# Patient Record
Sex: Male | Born: 1990 | Race: White | Hispanic: No | Marital: Single | Smoking: Current every day smoker
Health system: Southern US, Community
[De-identification: ages and names within clinical notes are randomized; demographics above are authoritative.]

## PROBLEM LIST (undated history)

## (undated) ENCOUNTER — Ambulatory Visit: Admission: EM | Payer: Self-pay

## (undated) DIAGNOSIS — F319 Bipolar disorder, unspecified: Secondary | ICD-10-CM

## (undated) DIAGNOSIS — F329 Major depressive disorder, single episode, unspecified: Secondary | ICD-10-CM

## (undated) DIAGNOSIS — F209 Schizophrenia, unspecified: Secondary | ICD-10-CM

## (undated) DIAGNOSIS — F32A Depression, unspecified: Secondary | ICD-10-CM

## (undated) HISTORY — PX: NO PAST SURGERIES: SHX2092

---
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
## 2013-05-05 ENCOUNTER — Emergency Department: Payer: Self-pay | Admitting: Emergency Medicine

## 2013-09-17 ENCOUNTER — Emergency Department: Payer: Self-pay | Admitting: Internal Medicine

## 2014-05-11 ENCOUNTER — Emergency Department: Payer: Self-pay | Admitting: Emergency Medicine

## 2014-08-02 ENCOUNTER — Emergency Department: Payer: Self-pay | Admitting: Emergency Medicine

## 2015-04-08 ENCOUNTER — Emergency Department
Admission: EM | Admit: 2015-04-08 | Discharge: 2015-04-08 | Disposition: A | Discharge status: Home / Self Care | Payer: BLUE CROSS/BLUE SHIELD | Attending: Emergency Medicine | Admitting: Emergency Medicine

## 2015-04-08 DIAGNOSIS — R109 Unspecified abdominal pain: Secondary | ICD-10-CM | POA: Diagnosis not present

## 2015-04-08 DIAGNOSIS — R112 Nausea with vomiting, unspecified: Secondary | ICD-10-CM | POA: Insufficient documentation

## 2015-04-08 DIAGNOSIS — Z79899 Other long term (current) drug therapy: Secondary | ICD-10-CM | POA: Insufficient documentation

## 2015-04-08 DIAGNOSIS — R509 Fever, unspecified: Secondary | ICD-10-CM | POA: Insufficient documentation

## 2015-04-08 DIAGNOSIS — R1033 Periumbilical pain: Secondary | ICD-10-CM | POA: Diagnosis present

## 2015-04-08 LAB — CBC WITH DIFFERENTIAL/PLATELET
Basophils Absolute: 0 10*3/uL (ref 0–0.1)
Basophils Relative: 1 %
EOS ABS: 0 10*3/uL (ref 0–0.7)
Eosinophils Relative: 1 %
HEMATOCRIT: 46.4 % (ref 40.0–52.0)
Hemoglobin: 15.8 g/dL (ref 13.0–18.0)
LYMPHS ABS: 1.2 10*3/uL (ref 1.0–3.6)
Lymphocytes Relative: 21 %
MCH: 30 pg (ref 26.0–34.0)
MCHC: 34 g/dL (ref 32.0–36.0)
MCV: 88.1 fL (ref 80.0–100.0)
MONO ABS: 0.6 10*3/uL (ref 0.2–1.0)
MONOS PCT: 11 %
NEUTROS PCT: 66 %
Neutro Abs: 3.8 10*3/uL (ref 1.4–6.5)
Platelets: 175 10*3/uL (ref 150–440)
RBC: 5.26 MIL/uL (ref 4.40–5.90)
RDW: 13.2 % (ref 11.5–14.5)
WBC: 5.8 10*3/uL (ref 3.8–10.6)

## 2015-04-08 LAB — COMPREHENSIVE METABOLIC PANEL
ALK PHOS: 50 U/L (ref 38–126)
ALT: 20 U/L (ref 17–63)
ANION GAP: 7 (ref 5–15)
AST: 20 U/L (ref 15–41)
Albumin: 4.9 g/dL (ref 3.5–5.0)
BUN: 15 mg/dL (ref 6–20)
CALCIUM: 9.2 mg/dL (ref 8.9–10.3)
CO2: 26 mmol/L (ref 22–32)
CREATININE: 1.05 mg/dL (ref 0.61–1.24)
Chloride: 109 mmol/L (ref 101–111)
Glucose, Bld: 88 mg/dL (ref 65–99)
POTASSIUM: 4.2 mmol/L (ref 3.5–5.1)
Sodium: 142 mmol/L (ref 135–145)
TOTAL PROTEIN: 7.3 g/dL (ref 6.5–8.1)
Total Bilirubin: 2.5 mg/dL — ABNORMAL HIGH (ref 0.3–1.2)

## 2015-04-08 LAB — URINALYSIS COMPLETE WITH MICROSCOPIC (ARMC ONLY)
BACTERIA UA: NONE SEEN
BILIRUBIN URINE: NEGATIVE
GLUCOSE, UA: NEGATIVE mg/dL
Hgb urine dipstick: NEGATIVE
KETONES UR: NEGATIVE mg/dL
Leukocytes, UA: NEGATIVE
NITRITE: NEGATIVE
Protein, ur: NEGATIVE mg/dL
Specific Gravity, Urine: 1.024 (ref 1.005–1.030)
pH: 5 (ref 5.0–8.0)

## 2015-04-08 LAB — LIPASE, BLOOD: Lipase: 17 U/L — ABNORMAL LOW (ref 22–51)

## 2015-04-08 MED ORDER — DICYCLOMINE HCL 20 MG PO TABS: 20.0000 mg | ORAL_TABLET | Freq: Three times a day (TID) | ORAL | Status: DC | PRN

## 2015-04-08 MED ORDER — ONDANSETRON HCL 4 MG PO TABS: 4.0000 mg | ORAL_TABLET | Freq: Every day | ORAL | Status: DC | PRN

## 2015-04-08 NOTE — ED Notes (Signed)
Patient had flu-like symptoms on Friday.  Had a temperature and bodyaches, N/V and diarrhea. Took ibuprofen and pepto-bismol on Friday and Saturday. Began to notice dark stools then. States it is better today but stools are darker than normal. Vomiting has decreased but nausea continues. Had hx of intestinal infection about 5 months ago.

## 2015-04-08 NOTE — ED Provider Notes (Signed)
[facility] Emergency Department Provider Note    ____________________________________________  Time seen: 2230  I have reviewed the triage vital signs and the nursing notes.   HISTORY  Chief Complaint Abdominal Pain   History limited by: Not Limited   HPI RALLY OUCH is a 24 y.o. male who presents to the emergency department today because of concerns for abdominal pain. Patient states that the abdominal pain is worse in the periumbilical region.The patient states that the pain started 3 days ago. It has been fairly constant. It is gradually gotten worse. He did have some associated nausea and vomiting. Additionally has had some decreased by mouth intake. He had some fevers at the beginning. He states that he had an intestinal infection roughly 5 months ago,  that pain was located in the right lower quadrant. No recent tick exposure.  No past medical history on file.  There are no active problems to display for this patient.   No past surgical history on file.  Current Outpatient Rx  Name  Route  Sig  Dispense  Refill  . clonazePAM (KLONOPIN) 1 MG tablet   Oral   Take 1 mg by mouth daily.         Marland Kitchen ibuprofen (ADVIL,MOTRIN) 200 MG tablet   Oral   Take 200 mg by mouth every 6 (six) hours as needed for fever, headache or moderate pain.           Allergies Poison ivy extract  No family history on file.  Social History Works with [employer]   Review of Systems  Constitutional: Positive for fever. Cardiovascular: Negative for chest pain. Respiratory: Negative for shortness of breath. Gastrointestinal: Positive for abdominal pain Genitourinary: Negative for dysuria. Musculoskeletal: Negative for back pain. Skin: Negative for rash. Neurological: Negative for headaches, focal weakness or numbness.  10-point ROS otherwise negative.  ____________________________________________   PHYSICAL EXAM:  VITAL SIGNS: ED Triage  Vitals  Enc Vitals Group     BP 04/08/15 1904 119/76 mmHg     Pulse Rate 04/08/15 1904 102     Resp 04/08/15 1904 18     Temp 04/08/15 1904 98.3 F (36.8 C)     Temp Source 04/08/15 1904 Oral     SpO2 04/08/15 1904 100 %     Weight 04/08/15 1904 130 lb (58.968 kg)     Height 04/08/15 1904 6' (1.829 m)     Head Cir --      Peak Flow --      Pain Score 04/08/15 1904 6   Constitutional: Alert and oriented. Well appearing and in no distress. Eyes: Conjunctivae are normal. PERRL. Normal extraocular movements. ENT   Head: Normocephalic and atraumatic.   Nose: No congestion/rhinnorhea.   Mouth/Throat: Mucous membranes are moist.   Neck: No stridor. Hematological/Lymphatic/Immunilogical: No cervical lymphadenopathy. Cardiovascular: Normal rate, regular rhythm.  No murmurs, rubs, or gallops. Respiratory: Normal respiratory effort without tachypnea nor retractions. Breath sounds are clear and equal bilaterally. No wheezes/rales/rhonchi. Gastrointestinal: Soft and minimally tender to palpation in the periumbilical region. No right lower quadrant tenderness. No Murphy sign. No rebound. No guarding. No distention.  Genitourinary: Deferred Musculoskeletal: Normal range of motion in all extremities. No joint effusions.  No lower extremity tenderness nor edema. Neurologic:  Normal speech and language. No gross focal neurologic deficits are appreciated. Speech is normal.  Skin:  Skin is warm, dry and intact. No rash noted. Psychiatric: Mood and affect are normal. Speech and behavior are normal. Patient  exhibits appropriate insight and judgment.  ____________________________________________    LABS (pertinent positives/negatives)  Labs Reviewed  COMPREHENSIVE METABOLIC PANEL - Abnormal; Notable for the following:    Total Bilirubin 2.5 (*)    All other components within normal limits  LIPASE, BLOOD - Abnormal; Notable for the following:    Lipase 17 (*)    All other components  within normal limits  URINALYSIS COMPLETEWITH MICROSCOPIC ([facility] ONLY) - Abnormal; Notable for the following:    Color, Urine YELLOW (*)    APPearance CLEAR (*)    Squamous Epithelial / LPF 0-5 (*)    All other components within normal limits  CBC WITH DIFFERENTIAL/PLATELET     ____________________________________________   EKG  None  ____________________________________________    RADIOLOGY  None  ____________________________________________   PROCEDURES  Procedure(s) performed: None  Critical Care performed: No  ____________________________________________   INITIAL IMPRESSION / ASSESSMENT AND PLAN / ED COURSE  Pertinent labs & imaging results that were available during my care of the patient were reviewed by me and considered in my medical decision making (see chart for details).  Patient presented to the emergency department today because of concerns for abdominal pain. On exam patient with very minimal tenderness palpation in periumbilical region. No right lower quadrant tenderness. Patient blood work here without any concerning findings except for mildly elevated bilirubin. I do question if this is secondary to stress and not eating. Patient does not have any Murphy sign or any other signs of obstructing gallbladder disease. I doubt patient is suffering from kidney stone, gallstones or appendicitis. Will plan on discharging home with Bentyl and antiemetics.  ____________________________________________   FINAL CLINICAL IMPRESSION(S) / ED DIAGNOSES  Final diagnoses:  Abdominal pain, unspecified abdominal location     Phineas Semen, MD 04/08/15 2341

## 2015-04-08 NOTE — ED Notes (Signed)
Abd pain for a week and black diarrhea hx of "infection in the stomach".

## 2015-04-08 NOTE — Discharge Instructions (Signed)
Please seek medical attention for any high fevers, chest pain, shortness of breath, change in behavior, persistent vomiting, bloody stool or any other new or concerning symptoms. ° °Abdominal Pain °Many things can cause abdominal pain. Usually, abdominal pain is not caused by a disease and will improve without treatment. It can often be observed and treated at home. Your health care provider will do a physical exam and possibly order blood tests and X-rays to help determine the seriousness of your pain. However, in many cases, more time must pass before a clear cause of the pain can be found. Before that point, your health care provider may not know if you need more testing or further treatment. °HOME CARE INSTRUCTIONS  °Monitor your abdominal pain for any changes. The following actions may help to alleviate any discomfort you are experiencing: °· Only take over-the-counter or prescription medicines as directed by your health care provider. °· Do not take laxatives unless directed to do so by your health care provider. °· Try a clear liquid diet (broth, tea, or water) as directed by your health care provider. Slowly move to a bland diet as tolerated. °SEEK MEDICAL CARE IF: °· You have unexplained abdominal pain. °· You have abdominal pain associated with nausea or diarrhea. °· You have pain when you urinate or have a bowel movement. °· You experience abdominal pain that wakes you in the night. °· You have abdominal pain that is worsened or improved by eating food. °· You have abdominal pain that is worsened with eating fatty foods. °· You have a fever. °SEEK IMMEDIATE MEDICAL CARE IF:  °· Your pain does not go away within 2 hours. °· You keep throwing up (vomiting). °· Your pain is felt only in portions of the abdomen, such as the right side or the left lower portion of the abdomen. °· You pass bloody or black tarry stools. °MAKE SURE YOU: °· Understand these instructions.   °· Will watch your condition.   °· Will  get help right away if you are not doing well or get worse.   °Document Released: 05/21/2005 Document Revised: 08/16/2013 Document Reviewed: 04/20/2013 °ExitCare® Patient Information ©2015 [employer]. This information is not intended to replace advice given to you by your health care provider. Make sure you discuss any questions you have with your health care provider. ° °

## 2015-10-01 ENCOUNTER — Other Ambulatory Visit: Payer: Self-pay | Admitting: Orthopedic Surgery

## 2015-10-01 DIAGNOSIS — M25561 Pain in right knee: Secondary | ICD-10-CM

## 2015-10-01 DIAGNOSIS — M25562 Pain in left knee: Secondary | ICD-10-CM

## 2015-10-11 ENCOUNTER — Ambulatory Visit
Admission: RE | Admit: 2015-10-11 | Discharge: 2015-10-11 | Disposition: A | Discharge status: Home / Self Care | Payer: BLUE CROSS/BLUE SHIELD | Source: Ambulatory Visit | Attending: Orthopedic Surgery | Admitting: Orthopedic Surgery

## 2015-10-11 DIAGNOSIS — M25562 Pain in left knee: Secondary | ICD-10-CM

## 2015-10-11 DIAGNOSIS — M25461 Effusion, right knee: Secondary | ICD-10-CM | POA: Diagnosis not present

## 2015-10-11 DIAGNOSIS — M948X6 Other specified disorders of cartilage, lower leg: Secondary | ICD-10-CM | POA: Insufficient documentation

## 2015-10-11 DIAGNOSIS — M25561 Pain in right knee: Secondary | ICD-10-CM | POA: Insufficient documentation

## 2015-10-31 ENCOUNTER — Emergency Department
Admission: EM | Admit: 2015-10-31 | Discharge: 2015-10-31 | Disposition: A | Discharge status: Home / Self Care | Payer: BLUE CROSS/BLUE SHIELD | Attending: Emergency Medicine | Admitting: Emergency Medicine

## 2015-10-31 DIAGNOSIS — J029 Acute pharyngitis, unspecified: Secondary | ICD-10-CM | POA: Insufficient documentation

## 2015-10-31 MED ORDER — AZITHROMYCIN 500 MG PO TABS
500.0000 mg | ORAL_TABLET | Freq: Once | ORAL | Status: AC
  Administered 2015-10-31: 500 mg via ORAL
  Filled 2015-10-31: qty 1

## 2015-10-31 MED ORDER — AZITHROMYCIN 250 MG PO TABS: ORAL_TABLET | ORAL | Status: DC

## 2015-10-31 NOTE — ED Notes (Signed)
Pt reports sore throat x 3 days. Pt denies fevers

## 2015-10-31 NOTE — Discharge Instructions (Signed)
Pharyngitis Pharyngitis is redness, pain, and swelling (inflammation) of your pharynx.  CAUSES  Pharyngitis is usually caused by infection. Most of the time, these infections are from viruses (viral) and are part of a cold. However, sometimes pharyngitis is caused by bacteria (bacterial). Pharyngitis can also be caused by allergies. Viral pharyngitis may be spread from person to person by coughing, sneezing, and personal items or utensils (cups, forks, spoons, toothbrushes). Bacterial pharyngitis may be spread from person to person by more intimate contact, such as kissing.  SIGNS AND SYMPTOMS  Symptoms of pharyngitis include:   Sore throat.   Tiredness (fatigue).   Low-grade fever.   Headache.  Joint pain and muscle aches.  Skin rashes.  Swollen lymph nodes.  Plaque-like film on throat or tonsils (often seen with bacterial pharyngitis). DIAGNOSIS  Your health care provider will ask you questions about your illness and your symptoms. Your medical history, along with a physical exam, is often all that is needed to diagnose pharyngitis. Sometimes, a rapid strep test is done. Other lab tests may also be done, depending on the suspected cause.  TREATMENT  Viral pharyngitis will usually get better in 3-4 days without the use of medicine. Bacterial pharyngitis is treated with medicines that kill germs (antibiotics).  HOME CARE INSTRUCTIONS   Drink enough water and fluids to keep your urine clear or pale yellow.   Only take over-the-counter or prescription medicines as directed by your health care provider:   If you are prescribed antibiotics, make sure you finish them even if you start to feel better.   Do not take aspirin.   Get lots of rest.   Gargle with 8 oz of salt water ( tsp of salt per 1 qt of water) as often as every 1-2 hours to soothe your throat.   Throat lozenges (if you are not at risk for choking) or sprays may be used to soothe your throat. SEEK MEDICAL  CARE IF:   You have large, tender lumps in your neck.  You have a rash.  You cough up green, yellow-brown, or bloody spit. SEEK IMMEDIATE MEDICAL CARE IF:   Your neck becomes stiff.  You drool or are unable to swallow liquids.  You vomit or are unable to keep medicines or liquids down.  You have severe pain that does not go away with the use of recommended medicines.  You have trouble breathing (not caused by a stuffy nose). MAKE SURE YOU:   Understand these instructions.  Will watch your condition.  Will get help right away if you are not doing well or get worse.   This information is not intended to replace advice given to you by your health care provider. Make sure you discuss any questions you have with your health care provider.   Document Released: 08/11/2005 Document Revised: 06/01/2013 Document Reviewed: 04/18/2013 Elsevier Interactive Patient Education 2016 [employer]  Sore Throat A sore throat is pain, burning, irritation, or scratchiness of the throat. There is often pain or tenderness when swallowing or talking. A sore throat may be accompanied by other symptoms, such as coughing, sneezing, fever, and swollen neck glands. A sore throat is often the first sign of another sickness, such as a cold, flu, strep throat, or mononucleosis (commonly known as mono). Most sore throats go away without medical treatment. CAUSES  The most common causes of a sore throat include:  A viral infection, such as a cold, flu, or mono.  A bacterial infection, such as strep throat,  tonsillitis, or whooping cough. °· Seasonal allergies. °· Dryness in the air. °· Irritants, such as smoke or pollution. °· Gastroesophageal reflux disease (GERD). °HOME CARE INSTRUCTIONS  °· Only take over-the-counter medicines as directed by your caregiver. °· Drink enough fluids to keep your urine clear or pale yellow. °· Rest as needed. °· Try using throat sprays, lozenges, or sucking on hard candy to ease  any pain (if older than 4 years or as directed). °· Sip warm liquids, such as broth, herbal tea, or warm water with honey to relieve pain temporarily. You may also eat or drink cold or frozen liquids such as frozen ice pops. °· Gargle with salt water (mix 1 tsp salt with 8 oz of water). °· Do not smoke and avoid secondhand smoke. °· Put a cool-mist humidifier in your bedroom at night to moisten the air. You can also turn on a hot shower and sit in the bathroom with the door closed for 5-10 minutes. °SEEK IMMEDIATE MEDICAL CARE IF: °· You have difficulty breathing. °· You are unable to swallow fluids, soft foods, or your saliva. °· You have increased swelling in the throat. °· Your sore throat does not get better in 7 days. °· You have nausea and vomiting. °· You have a fever or persistent symptoms for more than 2-3 days. °· You have a fever and your symptoms suddenly get worse. °MAKE SURE YOU:  °· Understand these instructions. °· Will watch your condition. °· Will get help right away if you are not doing well or get worse. °  °This information is not intended to replace advice given to you by your health care provider. Make sure you discuss any questions you have with your health care provider. °  °Document Released: 09/18/2004 Document Revised: 09/01/2014 Document Reviewed: 04/18/2012 °Elsevier Interactive Patient Education ©2016 Elsevier Inc. ° °

## 2015-10-31 NOTE — ED Provider Notes (Signed)
[facility] Emergency Department Provider Note  ____________________________________________  Time seen: Approximately 9:17 PM  I have reviewed the triage vital signs and the nursing notes.   HISTORY  Chief Complaint Sore Throat    HPI Peter Cisneros is a 25 y.o. male presents for evaluation of sore throat 3 days progressively getting worse. Patient denies any fevers or chills. States that his fiance/girlfriend was diagnosed with strep throat last week. States pain is worse when he is trying to sleep or swallow.    No past medical history on file.  There are no active problems to display for this patient.   No past surgical history on file.  Current Outpatient Rx  Name  Route  Sig  Dispense  Refill  . azithromycin (ZITHROMAX Z-PAK) 250 MG tablet      Take 2 tablets (500 mg) on  Day 1,  followed by 1 tablet (250 mg) once daily on Days 2 through 5.   6 each   0   . clonazePAM (KLONOPIN) 1 MG tablet   Oral   Take 1 mg by mouth daily.         Marland Kitchen dicyclomine (BENTYL) 20 MG tablet   Oral   Take 1 tablet (20 mg total) by mouth 3 (three) times daily as needed for spasms.   30 tablet   0   . ibuprofen (ADVIL,MOTRIN) 200 MG tablet   Oral   Take 200 mg by mouth every 6 (six) hours as needed for fever, headache or moderate pain.         Marland Kitchen ondansetron (ZOFRAN) 4 MG tablet   Oral   Take 1 tablet (4 mg total) by mouth daily as needed for nausea or vomiting.   15 tablet   0     Allergies Poison ivy extract  No family history on file.  Social History Social History  Substance Use Topics  . Smoking status: Not on file  . Smokeless tobacco: Not on file  . Alcohol Use: Not on file    Review of Systems Constitutional: No fever/chills Eyes: No visual changes. ENT: Positive sore throat Cardiovascular: Denies chest pain. Respiratory: Denies shortness of breath. Musculoskeletal: Negative for back pain. Skin: Negative for  rash. Neurological: Negative for headaches, focal weakness or numbness.  10-point ROS otherwise negative.  ____________________________________________   PHYSICAL EXAM:  VITAL SIGNS: ED Triage Vitals  Enc Vitals Group     BP 10/31/15 2038 98/54 mmHg     Pulse Rate 10/31/15 2038 77     Resp 10/31/15 2038 18     Temp 10/31/15 2038 97.9 F (36.6 C)     Temp src --      SpO2 10/31/15 2038 99 %     Weight 10/31/15 2038 145 lb (65.772 kg)     Height 10/31/15 2038  (1.803 m)     Head Cir --      Peak Flow --      Pain Score 10/31/15 2038 8     Pain Loc --      Pain Edu? --      Excl. in GC? --     Constitutional: Alert and oriented. Well appearing and in no acute distress. Head: Atraumatic. Nose: No congestion/rhinnorhea. Mouth/Throat: Mucous membranes are moist.  Oropharynx very erythematous, no exudate. Neck: No stridor.  Positive cervical adenopathy left greater than right. Cardiovascular: Normal rate, regular rhythm. Grossly normal heart sounds.  Good peripheral circulation. Respiratory: Normal respiratory effort.  No  retractions. Lungs CTAB. Neurologic:  Normal speech and language. No gross focal neurologic deficits are appreciated. No gait instability. Skin:  Skin is warm, dry and intact. No rash noted. Psychiatric: Mood and affect are normal. Speech and behavior are normal.  ____________________________________________   LABS (all labs ordered are listed, but only abnormal results are displayed)  Labs Reviewed - No data to display ____________________________________________    PROCEDURES  Procedure(s) performed: None  Critical Care performed: No  ____________________________________________   INITIAL IMPRESSION / ASSESSMENT AND PLAN / ED COURSE  Pertinent labs & imaging results that were available during my care of the patient were reviewed by me and considered in my medical decision making (see chart for details).  Acute pharyngitis probable  strep. We'll treat based on exposure girlfriend. Rx given for Zithromax Z-Pak patient follow-up PCP or return to ER when necessary. ____________________________________________   FINAL CLINICAL IMPRESSION(S) / ED DIAGNOSES  Final diagnoses:  Acute pharyngitis, unspecified pharyngitis type     This chart was dictated using voice recognition software/Dragon. Despite best efforts to proofread, errors can occur which can change the meaning. Any change was purely unintentional.   Evangeline Dakinharles M Bush Murdoch, PA-C 10/31/15 2122  Jeanmarie PlantJames A McShane, MD 11/01/15 [phone]

## 2016-02-26 ENCOUNTER — Emergency Department
Admission: EM | Admit: 2016-02-26 | Discharge: 2016-02-26 | Disposition: A | Discharge status: Home / Self Care | Payer: BLUE CROSS/BLUE SHIELD | Attending: Emergency Medicine | Admitting: Emergency Medicine

## 2016-02-26 ENCOUNTER — Encounter: Payer: Self-pay | Admitting: Emergency Medicine

## 2016-02-26 ENCOUNTER — Emergency Department: Payer: BLUE CROSS/BLUE SHIELD

## 2016-02-26 DIAGNOSIS — F3162 Bipolar disorder, current episode mixed, moderate: Secondary | ICD-10-CM | POA: Diagnosis present

## 2016-02-26 DIAGNOSIS — F209 Schizophrenia, unspecified: Secondary | ICD-10-CM | POA: Insufficient documentation

## 2016-02-26 DIAGNOSIS — F419 Anxiety disorder, unspecified: Secondary | ICD-10-CM | POA: Diagnosis not present

## 2016-02-26 DIAGNOSIS — F17200 Nicotine dependence, unspecified, uncomplicated: Secondary | ICD-10-CM | POA: Diagnosis present

## 2016-02-26 DIAGNOSIS — F1010 Alcohol abuse, uncomplicated: Secondary | ICD-10-CM | POA: Diagnosis present

## 2016-02-26 HISTORY — DX: Bipolar disorder, unspecified: F31.9

## 2016-02-26 HISTORY — DX: Schizophrenia, unspecified: F20.9

## 2016-02-26 LAB — COMPREHENSIVE METABOLIC PANEL
ALK PHOS: 52 U/L (ref 38–126)
ALT: 20 U/L (ref 17–63)
ANION GAP: 6 (ref 5–15)
AST: 23 U/L (ref 15–41)
Albumin: 4.9 g/dL (ref 3.5–5.0)
BILIRUBIN TOTAL: 2.1 mg/dL — AB (ref 0.3–1.2)
BUN: 10 mg/dL (ref 6–20)
CALCIUM: 9.1 mg/dL (ref 8.9–10.3)
CO2: 28 mmol/L (ref 22–32)
Chloride: 106 mmol/L (ref 101–111)
Creatinine, Ser: 1.07 mg/dL (ref 0.61–1.24)
GFR calc Af Amer: >60 mL/min (ref >60)
Glucose, Bld: 93 mg/dL (ref 65–99)
POTASSIUM: 4.3 mmol/L (ref 3.5–5.1)
Sodium: 140 mmol/L (ref 135–145)
TOTAL PROTEIN: 6.9 g/dL (ref 6.5–8.1)

## 2016-02-26 LAB — CBC
HCT: 47.3 % (ref 40.0–52.0)
Hemoglobin: 16.9 g/dL (ref 13.0–18.0)
MCH: 31.9 pg (ref 26.0–34.0)
MCHC: 35.8 g/dL (ref 32.0–36.0)
MCV: 89 fL (ref 80.0–100.0)
Platelets: 153 10*3/uL (ref 150–440)
RBC: 5.32 MIL/uL (ref 4.40–5.90)
RDW: 12.9 % (ref 11.5–14.5)
WBC: 7.2 10*3/uL (ref 3.8–10.6)

## 2016-02-26 LAB — URINE DRUG SCREEN, QUALITATIVE (ARMC ONLY)
AMPHETAMINES, UR SCREEN: NOT DETECTED
Barbiturates, Ur Screen: NOT DETECTED
Benzodiazepine, Ur Scrn: NOT DETECTED
COCAINE METABOLITE, UR ~~LOC~~: NOT DETECTED
Cannabinoid 50 Ng, Ur ~~LOC~~: NOT DETECTED
MDMA (ECSTASY) UR SCREEN: NOT DETECTED
Methadone Scn, Ur: NOT DETECTED
OPIATE, UR SCREEN: NOT DETECTED
PHENCYCLIDINE (PCP) UR S: NOT DETECTED
Tricyclic, Ur Screen: NOT DETECTED

## 2016-02-26 LAB — ETHANOL: ALCOHOL ETHYL (B): 62 mg/dL — AB (ref <5)

## 2016-02-26 LAB — ACETAMINOPHEN LEVEL: Acetaminophen (Tylenol), Serum: <10 ug/mL — ABNORMAL LOW (ref 10–30)

## 2016-02-26 LAB — SALICYLATE LEVEL: Salicylate Lvl: <4 mg/dL (ref 2.8–30.0)

## 2016-02-26 NOTE — ED Notes (Signed)

## 2016-02-26 NOTE — ED Notes (Signed)
Patient denies SI/HI and verbalizes that he will f/u with Theresa MulliganKathleen Sisk MD (number provided) for psychiatric care. Escorted to lobby to care of family.

## 2016-02-26 NOTE — ED Notes (Signed)
[facility]

## 2016-02-26 NOTE — ED Notes (Signed)
Pt taken to XR with XRT and security

## 2016-02-26 NOTE — ED Notes (Addendum)
Patient ambulatory with steady gait to ED, patient reports he was "trying to get anger out." Patient presents with blood on both knuckles, when asked patient reports he hit everything. Patient denies SI at this time. Pt vague in answering questions. Pt reports hx of bipolar and schizophrenia. Pt alert and oriented x 4, no increased work in breathing noted.

## 2016-02-26 NOTE — ED Provider Notes (Signed)
[facility] Emergency Department Provider Note   ____________________________________________  Time seen: Approximately 3:28 AM  I have reviewed the triage vital signs and the nursing notes.   HISTORY  Chief Complaint Psychiatric Evaluation    HPI Peter CopasJohn T Dehoyos is a 25 y.o. male who presents to the ED from home for behavioral medicine evaluation. Patient has a history of bipolar disorder and schizophrenia who states he lost his temper and punched a wall with both hands.Denies active SI/HI/AH/VH. Voices no medical complaints with the exception of right (dominant) hand pain. Tetanus is up-to-date. Nothing makes the pain better. Movement makes the pain worse.   Past Medical History  Diagnosis Date  . Bipolar 1 disorder (HCC)   . Schizophrenia (HCC)     There are no active problems to display for this patient.   History reviewed. No pertinent past surgical history.  Current Outpatient Rx  Name  Route  Sig  Dispense  Refill  . azithromycin (ZITHROMAX Z-PAK) 250 MG tablet      Take 2 tablets (500 mg) on  Day 1,  followed by 1 tablet (250 mg) once daily on Days 2 through 5.   6 each   0   . clonazePAM (KLONOPIN) 1 MG tablet   Oral   Take 1 mg by mouth daily.         Marland Kitchen. dicyclomine (BENTYL) 20 MG tablet   Oral   Take 1 tablet (20 mg total) by mouth 3 (three) times daily as needed for spasms.   30 tablet   0   . ibuprofen (ADVIL,MOTRIN) 200 MG tablet   Oral   Take 200 mg by mouth every 6 (six) hours as needed for fever, headache or moderate pain.         Marland Kitchen. ondansetron (ZOFRAN) 4 MG tablet   Oral   Take 1 tablet (4 mg total) by mouth daily as needed for nausea or vomiting.   15 tablet   0     Allergies Poison ivy extract  No family history on file.  Social History Social History  Substance Use Topics  . Smoking status: Current Every Day Smoker -- 2.00 packs/day  . Smokeless tobacco: None  . Alcohol Use: Yes    Review of  Systems  Constitutional: No fever/chills. Eyes: No visual changes. ENT: No sore throat. Cardiovascular: Denies chest pain. Respiratory: Denies shortness of breath. Gastrointestinal: No abdominal pain.  No nausea, no vomiting.  No diarrhea.  No constipation. Genitourinary: Negative for dysuria. Musculoskeletal: Positive for right hand pain. Negative for back pain. Skin: Negative for rash. Neurological: Negative for headaches, focal weakness or numbness.  10-point ROS otherwise negative.  ____________________________________________   PHYSICAL EXAM:  VITAL SIGNS: ED Triage Vitals  Enc Vitals Group     BP 02/26/16 0105 128/82 mmHg     Pulse Rate 02/26/16 0105 101     Resp 02/26/16 0105 18     Temp 02/26/16 0105 98.9 F (37.2 C)     Temp Source 02/26/16 0105 Oral     SpO2 02/26/16 0105 100 %     Weight 02/26/16 0105 140 lb (63.504 kg)     Height 02/26/16 0105 5\' 11"  (1.803 m)     Head Cir --      Peak Flow --      Pain Score 02/26/16 0105 8     Pain Loc --      Pain Edu? --      Excl. in GC? --  Constitutional: Asleep, awakened for exam. Alert and oriented. Well appearing and in no acute distress. Eyes: Conjunctivae are normal. PERRL. EOMI. Head: Atraumatic. Nose: No congestion/rhinnorhea. Mouth/Throat: Mucous membranes are moist.  Oropharynx non-erythematous. Neck: No stridor.  No cervical spine tenderness to palpation. Cardiovascular: Normal rate, regular rhythm. Grossly normal heart sounds.  Good peripheral circulation. Respiratory: Normal respiratory effort.  No retractions. Lungs CTAB. Gastrointestinal: Soft and nontender. No distention. No abdominal bruits. No CVA tenderness. Musculoskeletal: Right dorsal hand with scattered abrasions about knuckles. No bite marks noted. Full motor strength and sensation. No deformities noted. Posterior left hand with single minor abrasion.  Neurologic:  Normal speech and language. No gross focal neurologic deficits are  appreciated. No gait instability. Skin:  Skin is warm, dry and intact. No rash noted. Psychiatric: Mood and affect are normal. Speech and behavior are normal.  ____________________________________________   LABS (all labs ordered are listed, but only abnormal results are displayed)  Labs Reviewed  COMPREHENSIVE METABOLIC PANEL - Abnormal; Notable for the following:    Total Bilirubin 2.1 (*)    All other components within normal limits  ETHANOL - Abnormal; Notable for the following:    Alcohol, Ethyl (B) 62 (*)    All other components within normal limits  ACETAMINOPHEN LEVEL - Abnormal; Notable for the following:    Acetaminophen (Tylenol), Serum <10 (*)    All other components within normal limits  SALICYLATE LEVEL  CBC  URINE DRUG SCREEN, QUALITATIVE ([facility] ONLY)   ____________________________________________  EKG  None ____________________________________________  RADIOLOGY  Right hand x-rays (viewed by me, interpreted per Dr. Karie KirksBloomer): Negative. ____________________________________________   PROCEDURES  Procedure(s) performed: None  Procedures  Critical Care performed: No  ____________________________________________   INITIAL IMPRESSION / ASSESSMENT AND PLAN / ED COURSE  Pertinent labs & imaging results that were available during my care of the patient were reviewed by me and considered in my medical decision making (see chart for details).  25 year old male with a history of bipolar disorder and schizophrenia who presents for behavioral medicine evaluation. X-ray of right hand is negative for fracture. Laboratory and urinalysis results noted. Patient will remain voluntary pending TTS consult.  ----------------------------------------- 7:17 AM on 02/26/2016 -----------------------------------------  [facility]. He will remain on a voluntary  status. ____________________________________________   FINAL CLINICAL IMPRESSION(S) / ED DIAGNOSES  Final diagnoses:  Bipolar disorder, current episode mixed, moderate (HCC)  Schizophrenia, unspecified type (HCC)      NEW MEDICATIONS STARTED DURING THIS VISIT:  New Prescriptions   No medications on file     Note:  This document was prepared using Dragon voice recognition software and may include unintentional dictation errors.    Irean HongJade J Hennesy Sobalvarro, MD 02/26/16 [phone]

## 2016-02-26 NOTE — ED Notes (Signed)
Patient belongings placed in 2 belongings bag:  Brown boots, blue jeans, pink shirt, black LG phone with green case and white wall charger, two cigarette packs, and one white camo lighter, $13 bills and $2.03 in change.

## 2016-02-26 NOTE — Consult Note (Signed)
  PLAN: 1. Mr. Peter Cisneros does not meet criteria for psychiatric admission. Please discharge as appropriate.   2. He will follow up with Dr. Mila PalmerSisk, his primary psychiatrist of 2 years.  3. Full not to follow.

## 2016-02-26 NOTE — ED Notes (Signed)
Patient is calm and cooperative.  He states he had an "impulsive moment" and feels ready for discharge.  Explained POC.  Meal given.  He denies SI/HI.  Knucke lacerations uncovered and unremarkable.  Support offered.

## 2016-02-26 NOTE — ED Notes (Signed)
Patient is alert and oriented in no apparent distress. Patient denies SI, HI. Patient is encouraged to let nursing staff know of any concerns or needs.Will continue 15 minute checks and observation by security camera for safety. 
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
## 2016-02-26 NOTE — ED Notes (Signed)

## 2016-02-26 NOTE — BH Assessment (Signed)
Assessment Note  Peter Cisneros Peter Cisneros is an 25 y.o. male. Peter Cisneros arrived to the ED by way of his mother in a personal vechicle. He reports that he lost his temper and he could not calm himself down.  He states that this has happened quite a few times. He reports that he is not feeling depressed or anxious.  He states that he is ready to go. He denied suicidal or homicidal ideation or intent.  He denied current hallucinations.  He reports that he does hear voices at other times.  He reports that he is currently on medication for depression, anxiety, bipolar, and schizophrenia.  He reports a decrease in his sleep.  He reports having nightmares about every night. He reports having 3 beers last night.  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  He reports that he has been arguing with his girlfriend recently.   Diagnosis: Schizophrenia  Past Medical History:  Past Medical History  Diagnosis Date  . Bipolar 1 disorder (HCC)   . Schizophrenia (HCC)     History reviewed. No pertinent past surgical history.  Family History: No family history on file.  Social History:  reports that he has been smoking.  He does not have any smokeless tobacco history on file. He reports that he drinks alcohol. His drug history is not on file.  Additional Social History:  Alcohol / Drug Use History of alcohol / drug use?: Yes (Reports using alcohol at special events and infrequently)  CIWA: CIWA-Ar BP: 128/82 mmHg Pulse Rate: (!) 101 COWS:    Allergies:  Allergies  Allergen Reactions  . Poison Ivy Extract [Extract Of Poison Ivy] Rash    Home Medications:  (Not in a hospital admission)  OB/GYN Status:  No LMP for male patient.  General Assessment Data [facility] Assessment: In system Is this a Tele or Face-to-Face Assessment?: Face-to-Face Is this an Initial Assessment or a Re-assessment for this encounter?: Initial Assessment Marital status:  Single Maiden name: n/a Is patient pregnant?: No Pregnancy Status: No Living Arrangements: Alone Can pt return to current living arrangement?: Yes Admission Status: Voluntary Is patient capable of signing voluntary admission?: Yes Referral Source: Self/Family/Friend Insurance type: BCBS  Medical Screening Exam [facility] ONLY) Medical Exam completed: Yes  Crisis Care Plan Living Arrangements: Alone Legal Guardian: Other: (Self) Name of Psychiatrist: Theresa MulliganKathleen Sisk  Name of Therapist: None  Education Status Is patient currently in school?: No Current Grade: n/a Highest grade of school patient has completed: 10th Name of school: Home schooled Contact person: n/a  Risk to self with the past 6 months Suicidal Ideation: No Has patient been a risk to self within the past 6 months prior to admission? : No Suicidal Intent: No Has patient had any suicidal intent within the past 6 months prior to admission? : No Is patient at risk for suicide?: No Suicidal Plan?: No Has patient had any suicidal plan within the past 6 months prior to admission? : No Access to Means: No What has been your use of drugs/alcohol within the last 12 months?: occassional use of alcohol Previous Attempts/Gestures: No How many times?: 0 Other Self Harm Risks: Punches stuff Triggers for Past Attempts: None known Intentional Self Injurious Behavior:  (Puncehs stuff when angry) Family Suicide History: No Recent stressful life event(s):  (Relationship problems) Persecutory voices/beliefs?: No Depression: Yes Depression Symptoms: Feeling angry/irritable Substance abuse history and/or treatment for substance abuse?: No Suicide prevention  information given to non-admitted patients: Not applicable  Risk to Others within the past 6 months Homicidal Ideation: No Does patient have any lifetime risk of violence toward others beyond the six months prior to admission? : No Thoughts of Harm to Others: No Current  Homicidal Intent: No Current Homicidal Plan: No Access to Homicidal Means: No Identified Victim: None identified History of harm to others?: No Assessment of Violence: None Noted Violent Behavior Description: punches items Does patient have access to weapons?: No Criminal Charges Pending?: No Does patient have a court date: No Is patient on probation?: No  Psychosis Hallucinations: None noted (None current) Delusions: None noted  Mental Status Report Appearance/Hygiene: In scrubs Eye Contact: Fair Motor Activity: Unremarkable Speech: Logical/coherent Level of Consciousness: Quiet/awake Mood: Irritable Affect: Irritable Anxiety Level: Minimal Thought Processes: Coherent Judgement: Unimpaired Orientation: Person, Place, Time, Situation Obsessive Compulsive Thoughts/Behaviors: None  Cognitive Functioning Concentration: Normal Memory: Recent Intact IQ: Average Insight: Fair Impulse Control: Poor Appetite: Poor Sleep: Decreased Vegetative Symptoms: None  ADLScreening [facility]) Patient's cognitive ability adequate to safely complete daily activities?: Yes Patient able to express need for assistance with ADLs?: Yes Independently performs ADLs?: Yes (appropriate for developmental age)  Prior Inpatient Therapy Prior Inpatient Therapy: No Prior Therapy Dates: n/a Prior Therapy Facilty/Provider(s): n/a Reason for Treatment: n/a  Prior Outpatient Therapy Prior Outpatient Therapy: Yes Prior Therapy Dates: Current Prior Therapy Facilty/Provider(s): Dr. Theresa MulliganKathleen Sisk Reason for Treatment: Schizophrenia, bipolar,  Does patient have an ACCT team?: No Does patient have Intensive In-House Services?  : No Does patient have [facility] services? : No Does patient have P4CC services?: No  ADL Screening (condition at time of admission) Patient's cognitive ability adequate to safely complete daily activities?: Yes Patient able to express need for assistance with  ADLs?: Yes Independently performs ADLs?: Yes (appropriate for developmental age)       Abuse/Neglect Assessment (Assessment to be complete while patient is alone) Physical Abuse: Denies Verbal Abuse: Denies Sexual Abuse: Denies Exploitation of patient/patient's resources: Denies Self-Neglect: Denies Values / Beliefs Cultural Requests During Hospitalization: None   Advance Directives (For Healthcare) Does patient have an advance directive?: No Would patient like information on creating an advanced directive?: No - patient declined information    Additional Information 1:1 In Past 12 Months?: No CIRT Risk: No Elopement Risk: No Does patient have medical clearance?: Yes     Disposition:  Disposition Initial Assessment Completed for this Encounter: Yes Disposition of Patient: Other dispositions  On Site Evaluation by:   Reviewed with Physician:    Justice DeedsKeisha Lakishia Bourassa 02/26/2016 5:51 AM

## 2016-03-03 DIAGNOSIS — Z791 Long term (current) use of non-steroidal anti-inflammatories (NSAID): Secondary | ICD-10-CM | POA: Insufficient documentation

## 2016-03-03 DIAGNOSIS — Z79899 Other long term (current) drug therapy: Secondary | ICD-10-CM | POA: Insufficient documentation

## 2016-03-03 DIAGNOSIS — T424X4A Poisoning by benzodiazepines, undetermined, initial encounter: Secondary | ICD-10-CM | POA: Insufficient documentation

## 2016-03-03 DIAGNOSIS — F329 Major depressive disorder, single episode, unspecified: Secondary | ICD-10-CM | POA: Insufficient documentation

## 2016-03-03 DIAGNOSIS — F17200 Nicotine dependence, unspecified, uncomplicated: Secondary | ICD-10-CM | POA: Insufficient documentation

## 2016-03-03 DIAGNOSIS — F209 Schizophrenia, unspecified: Secondary | ICD-10-CM | POA: Diagnosis not present

## 2016-03-03 DIAGNOSIS — Y69 Unspecified misadventure during surgical and medical care: Secondary | ICD-10-CM | POA: Insufficient documentation

## 2016-03-03 DIAGNOSIS — F1994 Other psychoactive substance use, unspecified with psychoactive substance-induced mood disorder: Secondary | ICD-10-CM | POA: Diagnosis not present

## 2016-03-03 DIAGNOSIS — T424X1A Poisoning by benzodiazepines, accidental (unintentional), initial encounter: Secondary | ICD-10-CM | POA: Diagnosis present

## 2016-03-03 NOTE — ED Notes (Signed)
Pt arrived to the ED accompanied by his mother and fiancee for taking too much of his anxiety medication (7 clonazepam, 1 every hour) with the intent of clearing his mind. Pt denies SI and HI is AOx4 and looks depressed during triage.

## 2016-03-04 ENCOUNTER — Encounter: Payer: Self-pay | Admitting: Emergency Medicine

## 2016-03-04 ENCOUNTER — Emergency Department
Admission: EM | Admit: 2016-03-04 | Discharge: 2016-03-04 | Disposition: A | Discharge status: Home / Self Care | Payer: BLUE CROSS/BLUE SHIELD | Attending: Emergency Medicine | Admitting: Emergency Medicine

## 2016-03-04 DIAGNOSIS — F419 Anxiety disorder, unspecified: Secondary | ICD-10-CM | POA: Diagnosis present

## 2016-03-04 DIAGNOSIS — F1310 Sedative, hypnotic or anxiolytic abuse, uncomplicated: Secondary | ICD-10-CM

## 2016-03-04 DIAGNOSIS — F1994 Other psychoactive substance use, unspecified with psychoactive substance-induced mood disorder: Secondary | ICD-10-CM | POA: Diagnosis not present

## 2016-03-04 DIAGNOSIS — F329 Major depressive disorder, single episode, unspecified: Secondary | ICD-10-CM

## 2016-03-04 DIAGNOSIS — F32A Depression, unspecified: Secondary | ICD-10-CM

## 2016-03-04 DIAGNOSIS — F1010 Alcohol abuse, uncomplicated: Secondary | ICD-10-CM | POA: Diagnosis present

## 2016-03-04 DIAGNOSIS — T50904A Poisoning by unspecified drugs, medicaments and biological substances, undetermined, initial encounter: Secondary | ICD-10-CM

## 2016-03-04 HISTORY — DX: Depression, unspecified: F32.A

## 2016-03-04 HISTORY — DX: Major depressive disorder, single episode, unspecified: F32.9

## 2016-03-04 LAB — CBC WITH DIFFERENTIAL/PLATELET
Basophils Absolute: 0.3 10*3/uL — ABNORMAL HIGH (ref 0–0.1)
Basophils Relative: 4 %
Eosinophils Absolute: 0.1 10*3/uL (ref 0–0.7)
Eosinophils Relative: 1 %
HEMATOCRIT: 48.3 % (ref 40.0–52.0)
HEMOGLOBIN: 17.5 g/dL (ref 13.0–18.0)
LYMPHS ABS: 0.9 10*3/uL — AB (ref 1.0–3.6)
Lymphocytes Relative: 12 %
MCH: 32.1 pg (ref 26.0–34.0)
MCHC: 36.2 g/dL — AB (ref 32.0–36.0)
MCV: 88.7 fL (ref 80.0–100.0)
Monocytes Absolute: 0.5 10*3/uL (ref 0.2–1.0)
NEUTROS ABS: 6.2 10*3/uL (ref 1.4–6.5)
Platelets: 176 10*3/uL (ref 150–440)
RBC: 5.45 MIL/uL (ref 4.40–5.90)
RDW: 13.1 % (ref 11.5–14.5)
WBC: 8.1 10*3/uL (ref 3.8–10.6)

## 2016-03-04 LAB — COMPREHENSIVE METABOLIC PANEL
ALT: 15 U/L — AB (ref 17–63)
AST: 23 U/L (ref 15–41)
Albumin: 4.6 g/dL (ref 3.5–5.0)
Alkaline Phosphatase: 64 U/L (ref 38–126)
Anion gap: 10 (ref 5–15)
BILIRUBIN TOTAL: 2.7 mg/dL — AB (ref 0.3–1.2)
BUN: 7 mg/dL (ref 6–20)
CALCIUM: 9.2 mg/dL (ref 8.9–10.3)
CHLORIDE: 101 mmol/L (ref 101–111)
CO2: 27 mmol/L (ref 22–32)
Creatinine, Ser: 1.04 mg/dL (ref 0.61–1.24)
GLUCOSE: 144 mg/dL — AB (ref 65–99)
Potassium: 3.7 mmol/L (ref 3.5–5.1)
Sodium: 138 mmol/L (ref 135–145)
Total Protein: 7.2 g/dL (ref 6.5–8.1)

## 2016-03-04 LAB — URINE DRUG SCREEN, QUALITATIVE (ARMC ONLY)
Amphetamines, Ur Screen: NOT DETECTED
BARBITURATES, UR SCREEN: NOT DETECTED
Benzodiazepine, Ur Scrn: NOT DETECTED
CANNABINOID 50 NG, UR ~~LOC~~: NOT DETECTED
COCAINE METABOLITE, UR ~~LOC~~: NOT DETECTED
MDMA (Ecstasy)Ur Screen: NOT DETECTED
Methadone Scn, Ur: NOT DETECTED
OPIATE, UR SCREEN: NOT DETECTED
PHENCYCLIDINE (PCP) UR S: NOT DETECTED
Tricyclic, Ur Screen: NOT DETECTED

## 2016-03-04 LAB — SALICYLATE LEVEL: Salicylate Lvl: <4 mg/dL (ref 2.8–30.0)

## 2016-03-04 LAB — ACETAMINOPHEN LEVEL: Acetaminophen (Tylenol), Serum: <10 ug/mL — ABNORMAL LOW (ref 10–30)

## 2016-03-04 LAB — ETHANOL: Alcohol, Ethyl (B): 61 mg/dL — ABNORMAL HIGH (ref <5)

## 2016-03-04 NOTE — Discharge Instructions (Signed)
Drug Overdose °Drug overdose happens when you take too much of a drug. An overdose can occur with illegal drugs, prescription drugs, or over-the-counter (OTC) drugs. °The effects of drug overdose can be mild, dangerous, or even deadly. °CAUSES °Drug overdose may be caused by: °· Taking too much of a drug on purpose. °· Taking too much of a drug by accident. °· An error made by a health care provider who prescribes a drug. °· An error made by a pharmacist who fills the prescription order. °Drugs that commonly cause overdose include: °· Mental health drugs. °· Pain medicines. °· Illegal drugs. °· OTC cough and cold medicines. °· Heart medicines. °· Seizure medicines. °RISK FACTORS °Drug overdose is more likely in: °· Children. They may be attracted to colorful pills. Because of children's small size, even a small amount of a drug can be dangerous. °· Elderly people. They may be taking many different drugs. Elderly people may have difficulty reading labels or remembering when they last took their medicine. °The risk of drug overdose is also higher for someone who: °· Takes illegal drugs. °· Takes a drug and drinks alcohol. °· Has a mental health condition. °SYMPTOMS °Signs and symptoms of drug overdose depend on the drug and the amount that was taken. Common danger signs include: °· Behavior changes. °· Sleepiness. °· Slowed breathing. °· Nausea and vomiting. °· Seizures. °· Changes in eye pupil size (very large or very small). °If there are signs of very low blood pressure from a drug overdose (shock), emergency treatment is required. These signs include: °· Cold and clammy skin. °· Pale skin. °· Blue lips. °· Very slow breathing. °· Extreme sleepiness. °· Loss of consciousness. °DIAGNOSIS °Drug overdose may be diagnosed based on your symptoms. It is important that you tell your health care provider: °· All of the drugs that you have taken. °· When you took the drugs. °· Whether you were drinking alcohol. °Your health  care provider will do a physical exam. This exam may include: °· Checking and monitoring your heart rate and rhythm, your temperature, and your blood pressure (vital signs). °· Checking your breathing and oxygen level. °You may also have tests, including:  °· Urine tests to check for drugs in your system. °· Blood tests to check for: °¨ Drugs in your system. °¨ Signs of an imbalance of your blood minerals (electrolytes). °¨ Liver damage. °¨ Kidney damage. °TREATMENT °Supporting your vital signs and your breathing is the first step in treating a drug overdose. Treatment may also include: °· Receiving fluids and electrolytes through an IV tube. °· Having a breathing tube (endotracheal tube) inserted in your airway to help you breathe. °· Having a tube passed through your nose and into your stomach (nasogastric tube) to wash out your stomach. °· Medicines. You may get medicines to: °¨ Make you vomit. °¨ Absorb any medicine that is left in your digestive system (activated charcoal). °¨ Block or reverse the effect of the drug that caused the overdose. °· Having your blood filtered through an artificial kidney machine (hemodialysis). You may need this if your overdose is severe or if you have kidney failure. °· Having ongoing counseling and mental health support if you intentionally overdosed or used an illegal drug. °HOME CARE INSTRUCTIONS °· Take medicines only as directed by your health care provider. Always ask your health care provider to discuss the possible side effects of any new drug that you start taking. °· Keep a list of all of the drugs   that you take, including over-the-counter medicines. Bring this list with you to all of your medical visits. °· Read the drug inserts that come with your medicines. °· Do not use illegal drugs. °· Do not drink alcohol when taking drugs. °· Store all medicines in safety containers that are out of the reach of children. °· Keep the phone number of your local poison control  center near your phone or on your cell phone. °· Get help if you are struggling with alcohol or drug use. °· Get help if you are struggling with depression or another mental health problem. °· Keep all follow-up visits as directed by your health care provider. This is important. °SEEK MEDICAL CARE IF: °· Your symptoms return. °· You develop any new signs or symptoms when you are taking medicines. °SEEK IMMEDIATE MEDICAL CARE IF: °· You think that you or someone else may have taken too much of a drug. The hotline of the National Poison Control Center is (800) 222-1222. °· You or someone else is having symptoms of a drug overdose. °· You have serious thoughts about hurting yourself or others. °· You have chest pain. °· You have difficulty breathing. °· You have a loss of consciousness. °Drug overdose is an emergency. Do not wait to see if the symptoms will go away. Get medical help right away. Call your local emergency services (911 in the [place] Do not drive yourself to the hospital. °  °This information is not intended to replace advice given to you by your health care provider. Make sure you discuss any questions you have with your health care provider. °  °Document Released: 12/26/2014 Document Reviewed: 12/26/2014 °[employer] Interactive Patient Education ©2016 [employer] ° °

## 2016-03-04 NOTE — Consult Note (Signed)
[place] [facility] Consult   Reason for Consult:  Consult for 25 year old man with alcohol abuse and benzodiazepine abuse came in here with mood disorder and anxiety and depression and overuse of medicine. Referring Physician:  Corky Downs Patient Identification: Peter Cisneros MRN:  [id number] Principal Diagnosis: Substance induced mood disorder [facility]) Diagnosis:   Patient Active Problem List   Diagnosis Date Noted  . Benzodiazepine abuse [F13.10] 03/04/2016  . Substance induced mood disorder ([place]) [F19.94] 03/04/2016  . Anxiety disorder [F41.9] 02/26/2016  . Alcohol use disorder, mild, abuse [F10.10] 02/26/2016  . Tobacco use disorder [F17.200] 02/26/2016    Total Time spent with patient: 1 hour  Subjective:   Peter Cisneros is a 25 y.o. male patient admitted with not under commitment. Patient requested being brought here. He was drinking heavily yesterday. Estimates 9-10 beers. Also misused his clonazepam. Estimates 7 tablets taken rather than the prescribed 1 pill twice a day. Denies using other drugs. Says his anxiety continues to be bad. He has not been compliant with prescribed Depakote but does plan to go back to see his psychiatrist this Thursday. Patient denies any suicidal intent at all. Denies any wish to harm himself. Denies suicidal ideation. Claims that he has chronic auditory hallucinations which are very vaguely described. Does not appear to have other active signs of psychosis. No indication of threats to harm anyone else.  Substance abuse history: Binge abuse of alcohol. No history of DTs or seizures. No involvement in active substance abuse treatment. Has some insight into his abuse. Abuses his Klonopin but continues to have it prescribed.  Medical history: Denies any other active medical problems  Social history: Talks a lot about having a "fiance" and that if he could just see her all day every day then his problems would all be solved. 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 Mother is frequently involved in worry about his health..  HPI:  See note above. Abuse of alcohol and benzodiazepines. Chronic anxiety. Possible diagnosis of schizophrenia based on his report of hallucinations although the rest of his clinical picture is not very definitive of schizophrenia.  Past Psychiatric History: Patient has had one previous visit to our emergency room under almost identical circumstances. He is followed up by Dr. Dorann Ou in the community. Has an appointment this Thursday. Is suppose to be taking Depakote and Klonopin. No history of suicide attempts or violence to others although he talks a lot about having a temper and anger problem.  Risk to Self: Suicidal Ideation: Yes-Currently Present Suicidal Intent: Yes-Currently Present Is patient at risk for suicide?: Yes Suicidal Plan?: Yes-Currently Present Specify Current Suicidal Plan: Per patient, "to smash things" Access to Means:  (Unknown) What has been your use of drugs/alcohol within the last 12 months?: Patient denies (BAL is 61) How many times?: 0 Other Self Harm Risks: Punched holes in walls Triggers for Past Attempts: Unpredictable Intentional Self Injurious Behavior: Bruising Comment - Self Injurious Behavior: Punching things when he is angry Risk to Others: Homicidal Ideation: No Thoughts of Harm to Others: Yes-Currently Present Comment - Thoughts of Harm to Others: Voices telling him to harm others patient reports that he can control it Current Homicidal Intent: No Current Homicidal Plan: No Access to Homicidal Means: No Identified Victim: None Reported History of harm to others?: No Assessment of Violence: None Noted Violent Behavior Description: Punches objects when angry Does patient have access to weapons?: No Criminal Charges Pending?: No Does patient  have a court date: No Prior Inpatient Therapy: Prior Inpatient Therapy: Yes Prior Therapy  Dates: 2017 Prior Therapy Facilty/Provider(s): [facility] Reason for Treatment: Psychosis Prior Outpatient Therapy: Prior Outpatient Therapy: Yes Prior Therapy Dates: Current Prior Therapy Facilty/Provider(s): Dr. Rushie Chestnut Reason for Treatment: Schizophrenia, bipolar,  Does patient have an ACCT team?: No Does patient have Intensive In-House Services?  : No Does patient have [facility] services? : No Does patient have P4CC services?: No  Past Medical History:  Past Medical History  Diagnosis Date  . Bipolar 1 disorder ([place])   . Schizophrenia ([place])   . Depression    History reviewed. No pertinent past surgical history. Family History: History reviewed. No pertinent family history. Family Psychiatric  History: Patient says his father has a severe anger problem and a substance abuse problem. Social History:  History  Alcohol Use  . Yes     History  Drug Use No    Social History   Social History  . Marital Status: Single    Spouse Name: N/A  . Number of Children: N/A  . Years of Education: N/A   Social History Main Topics  . Smoking status: Current Every Day Smoker -- 2.00 packs/day  . Smokeless tobacco: None  . Alcohol Use: Yes  . Drug Use: No  . Sexual Activity: Yes    Birth Control/ Protection: Condom   Other Topics Concern  . None   Social History Narrative   Additional Social History:    Allergies:   Allergies  Allergen Reactions  . Poison Ivy Extract [Extract Of Poison Ivy] Rash    Labs:  Results for orders placed or performed during the hospital encounter of 03/04/16 (from the past 48 hour(s))  Comprehensive metabolic panel     Status: Abnormal   Collection Time: 03/03/16 11:58 PM  Result Value Ref Range   Sodium 138 135 - 145 mmol/L   Potassium 3.7 3.5 - 5.1 mmol/L   Chloride 101 101 - 111 mmol/L   CO2 27 22 - 32 mmol/L   Glucose, Bld 144 (H) 65 - 99 mg/dL   BUN 7 6 - 20 mg/dL   Creatinine, Ser 1.04 0.61 - 1.24 mg/dL   Calcium 9.2 8.9 - 10.3 mg/dL    Total Protein 7.2 6.5 - 8.1 g/dL   Albumin 4.6 3.5 - 5.0 g/dL   AST 23 15 - 41 U/L   ALT 15 (L) 17 - 63 U/L   Alkaline Phosphatase 64 38 - 126 U/L   Total Bilirubin 2.7 (H) 0.3 - 1.2 mg/dL   GFR calc non Af Amer >60 >60 mL/min   GFR calc Af Amer >60 >60 mL/min    Comment: (NOTE) The eGFR has been calculated using the CKD EPI equation. This calculation has not been validated in all clinical situations. eGFR's persistently <60 mL/min signify possible Chronic Kidney Disease.    Anion gap 10 5 - 15  Ethanol     Status: Abnormal   Collection Time: 03/03/16 11:58 PM  Result Value Ref Range   Alcohol, Ethyl (B) 61 (H) <5 mg/dL    Comment:        LOWEST DETECTABLE LIMIT FOR SERUM ALCOHOL IS 5 mg/dL FOR MEDICAL PURPOSES ONLY   CBC with Diff     Status: Abnormal   Collection Time: 03/03/16 11:58 PM  Result Value Ref Range   WBC 8.1 3.8 - 10.6 K/uL   RBC 5.45 4.40 - 5.90 MIL/uL   Hemoglobin 17.5 13.0 - 18.0 g/dL  Comment: RESULT REPEATED AND VERIFIED   HCT 48.3 40.0 - 52.0 %   MCV 88.7 80.0 - 100.0 fL   MCH 32.1 26.0 - 34.0 pg   MCHC 36.2 (H) 32.0 - 36.0 g/dL   RDW 13.1 11.5 - 14.5 %   Platelets 176 150 - 440 K/uL   Neutrophils Relative % 76% %   Neutro Abs 6.2 1.4 - 6.5 K/uL   Lymphocytes Relative 12% %   Lymphs Abs 0.9 (L) 1.0 - 3.6 K/uL   Monocytes Relative 7% %   Monocytes Absolute 0.5 0.2 - 1.0 K/uL   Eosinophils Relative 1% %   Eosinophils Absolute 0.1 0 - 0.7 K/uL   Basophils Relative 4% %   Basophils Absolute 0.3 (H) 0 - 0.1 K/uL  Urine Drug Screen, Qualitative ([facility] only)     Status: None   Collection Time: 03/04/16  1:42 AM  Result Value Ref Range   Tricyclic, Ur Screen NONE DETECTED NONE DETECTED   Amphetamines, Ur Screen NONE DETECTED NONE DETECTED   MDMA (Ecstasy)Ur Screen NONE DETECTED NONE DETECTED   Cocaine Metabolite,Ur [place] NONE DETECTED NONE DETECTED   Opiate, Ur Screen NONE DETECTED NONE DETECTED   Phencyclidine (PCP) Ur S NONE DETECTED NONE DETECTED    Cannabinoid 50 Ng, Ur [place] NONE DETECTED NONE DETECTED   Barbiturates, Ur Screen NONE DETECTED NONE DETECTED   Benzodiazepine, Ur Scrn NONE DETECTED NONE DETECTED   Methadone Scn, Ur NONE DETECTED NONE DETECTED    Comment: (NOTE) 798  Tricyclics, urine               Cutoff 1000 ng/mL 200  Amphetamines, urine             Cutoff 1000 ng/mL 300  MDMA (Ecstasy), urine           Cutoff 500 ng/mL 400  Cocaine Metabolite, urine       Cutoff 300 ng/mL 500  Opiate, urine                   Cutoff 300 ng/mL 600  Phencyclidine (PCP), urine      Cutoff 25 ng/mL 700  Cannabinoid, urine              Cutoff 50 ng/mL 800  Barbiturates, urine             Cutoff 200 ng/mL 900  Benzodiazepine, urine           Cutoff 200 ng/mL 1000 Methadone, urine                Cutoff 300 ng/mL 1100 1200 The urine drug screen provides only a preliminary, unconfirmed 1300 analytical test result and should not be used for non-medical 1400 purposes. Clinical consideration and professional judgment should 1500 be applied to any positive drug screen result due to possible 1600 interfering substances. A more specific alternate chemical method 1700 must be used in order to obtain a confirmed analytical result.  1800 Gas chromato graphy / mass spectrometry (GC/MS) is the preferred 1900 confirmatory method.   Salicylate level     Status: None   Collection Time: 03/04/16  4:16 AM  Result Value Ref Range   Salicylate Lvl <9.2 2.8 - 30.0 mg/dL  Acetaminophen level     Status: Abnormal   Collection Time: 03/04/16  4:16 AM  Result Value Ref Range   Acetaminophen (Tylenol), Serum <10 (L) 10 - 30 ug/mL    Comment:        THERAPEUTIC CONCENTRATIONS  VARY SIGNIFICANTLY. A RANGE OF 10-30 ug/mL MAY BE AN EFFECTIVE CONCENTRATION FOR MANY PATIENTS. HOWEVER, SOME ARE BEST TREATED AT CONCENTRATIONS OUTSIDE THIS RANGE. ACETAMINOPHEN CONCENTRATIONS >150 ug/mL AT 4 HOURS AFTER INGESTION AND >50 ug/mL AT 12 HOURS AFTER INGESTION  ARE OFTEN ASSOCIATED WITH TOXIC REACTIONS.     No current facility-administered medications for this encounter.   Current Outpatient Prescriptions  Medication Sig Dispense Refill  . clonazePAM (KLONOPIN) 1 MG tablet Take 1 mg by mouth daily.    . divalproex (DEPAKOTE) 500 MG DR tablet Take 1 tablet by mouth 2 (two) times daily.    Marland Kitchen ibuprofen (ADVIL,MOTRIN) 200 MG tablet Take 200 mg by mouth every 6 (six) hours as needed for fever, headache or moderate pain.    . ARIPiprazole (ABILIFY) 10 MG tablet Take 10 mg by mouth daily. Reported on 03/04/2016      Musculoskeletal: Strength & Muscle Tone: within normal limits Gait & Station: normal Patient leans: N/A  Psychiatric Specialty Exam: Physical Exam  Nursing note and vitals reviewed. Constitutional: He appears well-developed and well-nourished.  HENT:  Head: Normocephalic and atraumatic.  Eyes: Conjunctivae are normal. Pupils are equal, round, and reactive to light.  Neck: Normal range of motion.  Cardiovascular: Normal heart sounds.   Respiratory: Effort normal.  GI: Soft.  Musculoskeletal: Normal range of motion.  Neurological: He is alert.  Skin: Skin is warm and dry.  Psychiatric: His mood appears anxious. His affect is blunt. His speech is delayed. He is slowed. Thought content is not paranoid. Cognition and memory are normal. He expresses impulsivity. He expresses no suicidal ideation.    Review of Systems  Constitutional: Negative.   HENT: Negative.   Eyes: Negative.   Respiratory: Negative.   Cardiovascular: Negative.   Gastrointestinal: Negative.   Musculoskeletal: Negative.   Skin: Negative.   Neurological: Negative.   Psychiatric/Behavioral: Positive for hallucinations and substance abuse. Negative for depression, suicidal ideas and memory loss. The patient is nervous/anxious. The patient does not have insomnia.     Blood pressure 126/85, pulse 85, temperature 97.6 F (36.4 C), temperature source Oral, resp.  rate 18, height _0  (1.803 m), weight 63.957 kg (141 lb), SpO2 99 %.Body mass index is 19.67 kg/(m^2).  General Appearance: Fairly Groomed  Eye Contact:  Fair  Speech:  Slow  Volume:  Decreased  Mood:  Anxious  Affect:  Blunt  Thought Process:  Goal Directed  Orientation:  Full (Time, Place, and Person)  Thought Content:  Logical  Suicidal Thoughts:  No  Homicidal Thoughts:  No  Memory:  Immediate;   Good Recent;   Fair Remote;   Fair  Judgement:  Fair  Insight:  Fair  Psychomotor Activity:  Normal  Concentration:  Concentration: Fair  Recall:  [employer] of Knowledge:  Fair  Language:  Fair  Akathisia:  No  Handed:  Right  AIMS (if indicated):     Assets:  Desire for Improvement Financial Resources/Insurance Physical Health  ADL's:  Intact  Cognition:  WNL  Sleep:        Treatment Plan Summary: Plan Patient is not under commitment and does not meet commitment criteria. Once to be discharged. No clear indication for inpatient treatment. Primary problem that is bringing him into the hospital his binge abuse of alcohol and benzodiazepines. Chronic problems with mood instability are being managed outpatient. No need for hospital level treatment. Counseling and review of medication plan with the patient. Reviewed risks of continued abuse of alcohol and  strongly encouraged involvement in substance abuse treatment. Case reviewed with emergency room physician and TTS. Patient can be released from the emergency room to outpatient treatment.  Disposition: Patient does not meet criteria for psychiatric inpatient admission.  Alethia Berthold, MD 03/04/2016 11:02 AM
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
## 2016-03-04 NOTE — ED Provider Notes (Signed)
Cleared for d/c by Dr. Luz Lexlapacs   Novi Calia, MD 03/04/16 [id number]

## 2016-03-04 NOTE — ED Provider Notes (Signed)
[facility] Emergency Department Provider Note   ____________________________________________  Time seen: Approximately 341 AM  I have reviewed the triage vital signs and the nursing notes.   HISTORY  Chief Complaint Depression and Ingestion    HPI Peter Cisneros is a 25 y.o. male who comes into the hospital today after taking too much of his medication. The patient was seen here about a week ago for psychiatric evaluation. The patient reports that he took his Klonopin multiple times throughout the day. The patient is supposed to only take his Klonopin twice a day but he took it multiple times. The patient does not know exactly how many times but his girlfriend reported that it was about 7. The patient also reports that he was drinking. He said that he drank 10 beers. The patient reports that he just wasn't thinking about it when he took the medication. He denies trying to hurt himself. He is feeling anxious and feeling depressed. He reports that he did not take any more of his other medications. The patient's mother and his girlfriend brought him into the hospital. The patient is denying suicidal or homicidal ideation at this time.   Past Medical History  Diagnosis Date  . Bipolar 1 disorder (HCC)   . Schizophrenia (HCC)   . Depression     Patient Active Problem List   Diagnosis Date Noted  . Anxiety disorder 02/26/2016  . Alcohol use disorder, mild, abuse 02/26/2016  . Tobacco use disorder 02/26/2016    History reviewed. No pertinent past surgical history.  Current Outpatient Rx  Name  Route  Sig  Dispense  Refill  . clonazePAM (KLONOPIN) 1 MG tablet   Oral   Take 1 mg by mouth daily.         . divalproex (DEPAKOTE) 500 MG DR tablet   Oral   Take 1 tablet by mouth 2 (two) times daily.         Marland Kitchen. ibuprofen (ADVIL,MOTRIN) 200 MG tablet   Oral   Take 200 mg by mouth every 6 (six) hours as needed for fever, headache or moderate pain.           . ARIPiprazole (ABILIFY) 10 MG tablet   Oral   Take 10 mg by mouth daily. Reported on 03/04/2016           Allergies Poison ivy extract  History reviewed. No pertinent family history.  Social History Social History  Substance Use Topics  . Smoking status: Current Every Day Smoker -- 2.00 packs/day  . Smokeless tobacco: None  . Alcohol Use: Yes    Review of Systems Constitutional: No fever/chills Eyes: No visual changes. ENT: No sore throat. Cardiovascular: Denies chest pain. Respiratory: Denies shortness of breath. Gastrointestinal: No abdominal pain.  No nausea, no vomiting.  No diarrhea.  No constipation. Genitourinary: Negative for dysuria. Musculoskeletal: Negative for back pain. Skin: Negative for rash. Neurological: Negative for headaches, focal weakness or numbness.  10-point ROS otherwise negative.  ____________________________________________   PHYSICAL EXAM:  VITAL SIGNS: ED Triage Vitals  Enc Vitals Group     BP 03/04/16 0001 128/78 mmHg     Pulse Rate 03/04/16 0001 96     Resp 03/04/16 0001 18     Temp 03/04/16 0001 97.6 F (36.4 C)     Temp Source 03/04/16 0001 Oral     SpO2 03/03/16 2352 100 %     Weight 03/04/16 0001 141 lb (63.957 kg)     Height  03/04/16 0001  (1.803 m)     Head Cir --      Peak Flow --      Pain Score 03/04/16 0002 0     Pain Loc --      Pain Edu? --      Excl. in GC? --     Constitutional: Alert and oriented. Well appearing and in no acute distress. Eyes: Conjunctivae are normal. PERRL. EOMI. Head: Atraumatic. Nose: No congestion/rhinnorhea. Mouth/Throat: Mucous membranes are moist.  Oropharynx non-erythematous. Cardiovascular: Normal rate, regular rhythm. Grossly normal heart sounds.  Good peripheral circulation. Respiratory: Normal respiratory effort.  No retractions. Lungs CTAB. Gastrointestinal: Soft and nontender. No distention. Positive bowel sounds Musculoskeletal: No lower extremity tenderness  nor edema.   Neurologic:  Normal speech and language.  Skin:  Skin is warm, dry and intact.  Psychiatric: Mood and affect are normal. Patient denies suicidal ideation.  ____________________________________________   LABS (all labs ordered are listed, but only abnormal results are displayed)  Labs Reviewed  COMPREHENSIVE METABOLIC PANEL - Abnormal; Notable for the following:    Glucose, Bld 144 (*)    ALT 15 (*)    Total Bilirubin 2.7 (*)    All other components within normal limits  ETHANOL - Abnormal; Notable for the following:    Alcohol, Ethyl (B) 61 (*)    All other components within normal limits  CBC WITH DIFFERENTIAL/PLATELET - Abnormal; Notable for the following:    MCHC 36.2 (*)    Lymphs Abs 0.9 (*)    Basophils Absolute 0.3 (*)    All other components within normal limits  ACETAMINOPHEN LEVEL - Abnormal; Notable for the following:    Acetaminophen (Tylenol), Serum <10 (*)    All other components within normal limits  URINE DRUG SCREEN, QUALITATIVE ([facility] ONLY)  SALICYLATE LEVEL   ____________________________________________  EKG  none ____________________________________________  RADIOLOGY  none ____________________________________________   PROCEDURES  Procedure(s) performed: None  Procedures  Critical Care performed: No  ____________________________________________   INITIAL IMPRESSION / ASSESSMENT AND PLAN / ED COURSE  Pertinent labs & imaging results that were available during my care of the patient were reviewed by me and considered in my medical decision making (see chart for details).  This is a 26 year old male who comes into the hospital today after overtaking his medication. The patient denies suicidal ideation but he is here for evaluation. He will be Seen by psych. ____________________________________________   FINAL CLINICAL IMPRESSION(S) / ED DIAGNOSES  Final diagnoses:  Depression  Overdose, undetermined intent, initial  encounter      NEW MEDICATIONS STARTED DURING THIS VISIT:  New Prescriptions   No medications on file     Note:  This document was prepared using Dragon voice recognition software and may include unintentional dictation errors.    Rebecka Apley, MD 03/04/16 [phone]

## 2016-05-28 ENCOUNTER — Encounter: Payer: Self-pay | Admitting: *Deleted

## 2016-05-28 DIAGNOSIS — R6884 Jaw pain: Secondary | ICD-10-CM | POA: Diagnosis present

## 2016-05-28 DIAGNOSIS — K1120 Sialoadenitis, unspecified: Secondary | ICD-10-CM | POA: Insufficient documentation

## 2016-05-28 DIAGNOSIS — Z791 Long term (current) use of non-steroidal anti-inflammatories (NSAID): Secondary | ICD-10-CM | POA: Insufficient documentation

## 2016-05-28 DIAGNOSIS — F17200 Nicotine dependence, unspecified, uncomplicated: Secondary | ICD-10-CM | POA: Insufficient documentation

## 2016-05-28 NOTE — ED Triage Notes (Signed)
Pt has right jaw and neck pain since this am.  States it hurts to swallow.  No sore throat.  States it hurts to touch neck/jaw area.  No resp distress.   Pt alert.

## 2016-05-29 ENCOUNTER — Emergency Department
Admission: EM | Admit: 2016-05-29 | Discharge: 2016-05-29 | Disposition: A | Discharge status: Home / Self Care | Payer: BLUE CROSS/BLUE SHIELD | Attending: Emergency Medicine | Admitting: Emergency Medicine

## 2016-05-29 DIAGNOSIS — K1120 Sialoadenitis, unspecified: Secondary | ICD-10-CM

## 2016-05-29 MED ORDER — PENICILLIN V POTASSIUM 500 MG PO TABS: 500.0000 mg | ORAL_TABLET | Freq: Four times a day (QID) | ORAL | 0 refills | Status: DC

## 2016-05-29 MED ORDER — PENICILLIN V POTASSIUM 250 MG PO TABS
500.0000 mg | ORAL_TABLET | Freq: Once | ORAL | Status: AC
  Administered 2016-05-29: 500 mg via ORAL
  Filled 2016-05-29: qty 2

## 2016-05-29 NOTE — ED Notes (Signed)

## 2016-05-29 NOTE — ED Notes (Signed)
EDP at bedside upon this RN to room for assessment. See MD note.

## 2016-05-29 NOTE — ED Provider Notes (Signed)
[facility] Emergency Department Provider Note  Time seen: 12:59 AM  I have reviewed the triage vital signs and the nursing notes.   HISTORY  Chief Complaint Jaw Pain    HPI Peter Cisneros is a 25 y.o. male with a past medical history of bipolar who presents to the emergency department with right jaw pain. According to the patient since this morning he has been having pain underneath his right jaw. Patient states it is tender to touch this area. States some pain with chewing. Denies any trauma. Denies any sore throat or fever. Describes his discomfort as mild.  Past Medical History:  Diagnosis Date  . Bipolar 1 disorder (HCC)   . Depression   . Schizophrenia [facility])     Patient Active Problem List   Diagnosis Date Noted  . Benzodiazepine abuse 03/04/2016  . Substance induced mood disorder (HCC) 03/04/2016  . Anxiety disorder 02/26/2016  . Alcohol use disorder, mild, abuse 02/26/2016  . Tobacco use disorder 02/26/2016    No past surgical history on file.  Prior to Admission medications   Medication Sig Start Date End Date Taking? Authorizing Provider  ARIPiprazole (ABILIFY) 10 MG tablet Take 10 mg by mouth daily. Reported on 03/04/2016 02/28/16   Historical Provider, MD  clonazePAM (KLONOPIN) 1 MG tablet Take 1 mg by mouth daily.    Historical Provider, MD  divalproex (DEPAKOTE) 500 MG DR tablet Take 1 tablet by mouth 2 (two) times daily. 02/28/16   Historical Provider, MD  ibuprofen (ADVIL,MOTRIN) 200 MG tablet Take 200 mg by mouth every 6 (six) hours as needed for fever, headache or moderate pain.    Historical Provider, MD    Allergies  Allergen Reactions  . Poison Ivy Extract [Poison Ivy Extract] Rash    No family history on file.  Social History Social History  Substance Use Topics  . Smoking status: Current Every Day Smoker    Packs/day: 2.00  . Smokeless tobacco: Never Used  . Alcohol use Yes    Review of Systems Constitutional:  Negative for fever. ENT: Negative for sore throat. Cardiovascular: Negative for chest pain. Respiratory: Negative for shortness of breath. Gastrointestinal: Negative for abdominal pain Skin: Negative for rash. Neurological: Negative for headache 10-point ROS otherwise negative.  ____________________________________________   PHYSICAL EXAM:  VITAL SIGNS: ED Triage Vitals  Enc Vitals Group     BP 05/28/16 2256 (!) 159/89     Pulse Rate 05/28/16 2256 79     Resp 05/28/16 2256 18     Temp 05/28/16 2256 98.2 F (36.8 C)     Temp Source 05/28/16 2256 Oral     SpO2 05/28/16 2256 100 %     Weight 05/28/16 2259 145 lb (65.8 kg)     Height 05/28/16 2259 5\' 11"  (1.803 m)     Head Circumference --      Peak Flow --      Pain Score 05/28/16 2259 5     Pain Loc --      Pain Edu? --      Excl. in GC? --     Constitutional: Alert and oriented. Well appearing and in no distress. Eyes: Normal exam ENT   Head: Normocephalic and atraumatic.   Mouth/Throat: Mucous membranes are moist.Mild tenderness palpation in the submandibular area in the mid jaw on the right side. Floor the mouth is soft, nontender. Patient does have somewhat poor dentition in this area but no obvious abscess or sign of dental infection.  No obvious swelling appreciated. Cardiovascular: Normal rate, regular rhythm. No murmur Respiratory: Normal respiratory effort without tachypnea nor retractions. Breath sounds are clear  Gastrointestinal: Soft and nontender. No distention.   Musculoskeletal: Nontender with normal range of motion in all extremities. Neurologic:  Normal speech and language. No gross focal neurologic deficits  Skin:  Skin is warm, dry and intact.  Psychiatric: Mood and affect are normal.  ____________________________________________   INITIAL IMPRESSION / ASSESSMENT AND PLAN / ED COURSE  Pertinent labs & imaging results that were available during my care of the patient were reviewed by me and  considered in my medical decision making (see chart for details).  Patient is tender over the submandibular area, possibly representing sialoadenitis. Patient does have somewhat of poor dentition, cannot rule out an underlying dental infection. I discussed with the patient using hard candies/sour candy, warm compresses to the area, we'll place the patient on penicillin for the next 10 days. Patient is agreeable to this plan of care. I discussed return precautions for any increased pain, fever or swelling.  ____________________________________________   FINAL CLINICAL IMPRESSION(S) / ED DIAGNOSES  Sialoadenitis    Minna AntisKevin Penney Domanski, MD 05/29/16 [phone]

## 2016-06-21 MED ORDER — IPRATROPIUM-ALBUTEROL 0.5-2.5 (3) MG/3ML IN SOLN
RESPIRATORY_TRACT | Status: AC
  Filled 2016-06-21: qty 3

## 2016-12-22 ENCOUNTER — Encounter: Payer: Self-pay | Admitting: *Deleted

## 2016-12-22 ENCOUNTER — Ambulatory Visit: Payer: BLUE CROSS/BLUE SHIELD

## 2016-12-22 ENCOUNTER — Ambulatory Visit (INDEPENDENT_AMBULATORY_CARE_PROVIDER_SITE_OTHER): Payer: BLUE CROSS/BLUE SHIELD

## 2016-12-22 ENCOUNTER — Ambulatory Visit: Admission: EM | Admit: 2016-12-22 | Discharge: 2016-12-22 | Discharge status: Absent without leave | Payer: Self-pay

## 2016-12-22 ENCOUNTER — Ambulatory Visit
Admission: EM | Admit: 2016-12-22 | Discharge: 2016-12-22 | Disposition: A | Discharge status: Home / Self Care | Payer: BLUE CROSS/BLUE SHIELD | Attending: Family Medicine | Admitting: Family Medicine

## 2016-12-22 DIAGNOSIS — M79644 Pain in right finger(s): Secondary | ICD-10-CM

## 2016-12-22 DIAGNOSIS — R21 Rash and other nonspecific skin eruption: Secondary | ICD-10-CM | POA: Diagnosis not present

## 2016-12-22 MED ORDER — MELOXICAM 15 MG PO TABS: 15.0000 mg | ORAL_TABLET | Freq: Every day | ORAL | 0 refills | Status: DC | PRN

## 2016-12-22 MED ORDER — CLOTRIMAZOLE-BETAMETHASONE 1-0.05 % EX CREA: TOPICAL_CREAM | CUTANEOUS | 0 refills | Status: DC

## 2016-12-22 NOTE — ED Triage Notes (Signed)
Also, rash to left forearm x1 year.

## 2016-12-22 NOTE — Discharge Instructions (Signed)
Take medication as prescribed. Rest. Drink plenty of fluids. Avoid reinjury.  Follow up with your primary care physician this week as needed. Follow up with dermatology or orthopedic as needed for continued complaints as discussed. Return to Urgent care for new or worsening concerns.

## 2016-12-22 NOTE — ED Triage Notes (Signed)
Pt fell 2 weeks ago landing on right hand. Now c/o right hand, thumb, and forearm pain with edema at the base of thumb.

## 2016-12-22 NOTE — ED Provider Notes (Signed)
[facility] ____________________________________________  Time seen: Approximately 7:38 PM  I have reviewed the triage vital signs and the nursing notes.   HISTORY  Chief Complaint Finger Injury   HPI Peter Cisneros is a 26 y.o. male presenting for evaluation of right thumb pain that occurred after injury 2 weeks ago. Patient reports that he was walking up stairs, tripped and fell forward catching himself with his hands. Patient states that he hit his right thumb directly downwards on the step causing pain. Denies other pain or injury. Denies head injury or loss of consciousness. Reports right hand dominant. Reports has had continued thumb pain since injury. Reports pain does intermittent may radiate up arm but denies any  continuous radiation of pain. Denies paresthesias. Denies other injury. Reports has occasionally been taking over-the-counter ibuprofen without resolution. States some tightness with bending, but denies actual decrease of range of motion.  Also progress evaluation of left forearm rash. Reports he has overall had the rash is left forearm that is been present for approximately one year. States some itching to the rash, denies pain or acute change. States rash did improve a few months ago but then returned. Reports did apply some lotion to the area thinking of his dry skin but has not been using any other over-the-counter medications or creams. Denies known trigger. Denies other rash. Denies changes in foods, medicines, lotions, detergents or contacts. Denies others at home with similar. Denies insect bite.  Denies chest pain, shortness of breath, abdominal pain. Denies recent sickness. Denies recent antibiotic use.    Past Medical History:  Diagnosis Date  . Bipolar 1 disorder (HCC)   . Depression   . Schizophrenia [facility])     Patient Active Problem List   Diagnosis Date Noted  . Benzodiazepine abuse 03/04/2016  . Substance induced mood disorder (HCC)  03/04/2016  . Anxiety disorder 02/26/2016  . Alcohol use disorder, mild, abuse 02/26/2016  . Tobacco use disorder 02/26/2016    History reviewed. No pertinent surgical history.   No current facility-administered medications for this encounter.   Current Outpatient Prescriptions:  .  ibuprofen (ADVIL,MOTRIN) 200 MG tablet, Take 200 mg by mouth every 6 (six) hours as needed for fever, headache or moderate pain., Disp: , Rfl:  .  clotrimazole-betamethasone (LOTRISONE) cream, Apply to left forearm rash 2 times for one week, Disp: 15 g, Rfl: 0 .  meloxicam (MOBIC) 15 MG tablet, Take 1 tablet (15 mg total) by mouth daily as needed., Disp: 10 tablet, Rfl: 0  Allergies Poison ivy extract [poison ivy extract]  History reviewed. No pertinent family history.  Social History Social History  Substance Use Topics  . Smoking status: Current Every Day Smoker    Packs/day: 2.00  . Smokeless tobacco: Never Used  . Alcohol use Yes    Review of Systems Constitutional: No fever/chills Eyes: No visual changes. ENT: No sore throat. Cardiovascular: Denies chest pain. Respiratory: Denies shortness of breath. Gastrointestinal: No abdominal pain.  No nausea, no vomiting.  No diarrhea.  No constipation. Genitourinary: Negative for dysuria. Musculoskeletal: Negative for back pain. As above.  Skin: Positive for rash.   ____________________________________________   PHYSICAL EXAM:  VITAL SIGNS: ED Triage Vitals  Enc Vitals Group     BP 12/22/16 1908 121/83     Pulse Rate 12/22/16 1908 70     Resp 12/22/16 1908 16     Temp 12/22/16 1908 98.6 F (37 C)     Temp Source 12/22/16 1908 Oral  SpO2 12/22/16 1908 100 %     Weight 12/22/16 1909 140 lb (63.5 kg)     Height 12/22/16 1909  (1.803 m)     Head Circumference --      Peak Flow --      Pain Score --      Pain Loc --      Pain Edu? --      Excl. in GC? --     Constitutional: Alert and oriented. Well appearing and in no  acute distress. Cardiovascular: Normal rate, regular rhythm. Grossly normal heart sounds.  Good peripheral circulation. Respiratory: Normal respiratory effort without tachypnea nor retractions. Breath sounds are clear and equal bilaterally. No wheezes, rales, rhonchi.. Musculoskeletal: Ambulatory with a steady gait. No midline cervical, thoracic or lumbar tenderness to palpation.  Except: Right first digit proximal phalanx, MCP and distal metacarpal mild tenderness to direct palpation, minimal swelling, no ecchymosis, full range of motion present, good resisted flexion and extension, no motor or tendon deficit, normal distal sensation and capillary refill, right hand otherwise nontender. Right upper extremity otherwise nontender. Bilateral distal radial pulses equal and easily palpated.  Neurologic:  Normal speech and language. Speech is normal. No gait instability.  Skin:  Skin is warm, dry. Except: Left forearm circular appearing rash around tattoo mildly erythematous with papules, nonvesicular, no drainage, No honey colored drainage, slight central clearing, also with some dry scaly skin, nontender, no surrounding erythema, no edema noted. No other rash noted. Psychiatric: Mood and affect are normal. Speech and behavior are normal. Patient exhibits appropriate insight and judgment   ___________________________________________   LABS (all labs ordered are listed, but only abnormal results are displayed)  Labs Reviewed - No data to display ____________________________________________  RADIOLOGY  Dg Finger Thumb Right  Result Date: 12/22/2016 CLINICAL DATA:  Patient fell down steps 2 weeks ago and jammed finger. Pain at MCP joint EXAM: RIGHT THUMB 2+V COMPARISON:  Right hand films from 02/26/2016. FINDINGS: No evidence of fracture on the provided images. No subluxation or dislocation. No worrisome lytic or sclerotic osseous abnormality. IMPRESSION: Negative. Electronically Signed   By: Kennith Center M.D.   On: 12/22/2016 20:11   ____________________________________________   PROCEDURES Procedures    INITIAL IMPRESSION / ASSESSMENT AND PLAN / ED COURSE  Pertinent labs & imaging results that were available during my care of the patient were reviewed by me and considered in my medical decision making (see chart for details).  Well-appearing patient. No acute distress. Right thumb x-ray per radiologist negative. Suspect contusion and sprain injury. Encouraged rest, ice, elevation and stretching. Will start patient on oral daily mobile and discussed follow-up with orthopedic as needed for continued pain. Left forearm rash that has been present for approximately 1 year with some itching, denies known trigger. Discussed with patient concern for fungal rash and will start patient on Lotrisone cream. Encouraged patient to follow-up with dermatology as needed for continued rash. Discussed indication, risks and benefits of medications with patient.  Discussed follow up with Primary care physician this week. Discussed follow up and return parameters including no resolution or any worsening concerns. Patient verbalized understanding and agreed to plan.   ____________________________________________   FINAL CLINICAL IMPRESSION(S) / ED DIAGNOSES  Final diagnoses:  Pain of right thumb  Rash     Discharge Medication List as of 12/22/2016  8:23 PM    START taking these medications   Details  clotrimazole-betamethasone (LOTRISONE) cream Apply to left forearm rash 2 times for  one week, Normal    meloxicam (MOBIC) 15 MG tablet Take 1 tablet (15 mg total) by mouth daily as needed., Starting Mon 12/22/2016, Normal        Note: This dictation was prepared with Dragon dictation along with smaller phrase technology. Any transcriptional errors that result from this process are unintentional.         Renford Dills, NP 12/22/16 2121

## 2017-01-13 ENCOUNTER — Encounter: Payer: Self-pay | Admitting: *Deleted

## 2017-01-13 ENCOUNTER — Emergency Department
Admission: EM | Admit: 2017-01-13 | Discharge: 2017-01-13 | Disposition: A | Discharge status: Home / Self Care | Payer: BLUE CROSS/BLUE SHIELD | Attending: Student in an Organized Health Care Education/Training Program | Admitting: Student in an Organized Health Care Education/Training Program

## 2017-01-13 DIAGNOSIS — F17200 Nicotine dependence, unspecified, uncomplicated: Secondary | ICD-10-CM | POA: Insufficient documentation

## 2017-01-13 DIAGNOSIS — W57XXXA Bitten or stung by nonvenomous insect and other nonvenomous arthropods, initial encounter: Secondary | ICD-10-CM | POA: Diagnosis not present

## 2017-01-13 DIAGNOSIS — Y929 Unspecified place or not applicable: Secondary | ICD-10-CM | POA: Diagnosis not present

## 2017-01-13 DIAGNOSIS — Y999 Unspecified external cause status: Secondary | ICD-10-CM | POA: Diagnosis not present

## 2017-01-13 DIAGNOSIS — Y939 Activity, unspecified: Secondary | ICD-10-CM | POA: Diagnosis not present

## 2017-01-13 DIAGNOSIS — S80862A Insect bite (nonvenomous), left lower leg, initial encounter: Secondary | ICD-10-CM | POA: Diagnosis not present

## 2017-01-13 MED ORDER — TRAMADOL HCL 50 MG PO TABS: 50.0000 mg | ORAL_TABLET | Freq: Two times a day (BID) | ORAL | 0 refills | Status: DC | PRN

## 2017-01-13 MED ORDER — SULFAMETHOXAZOLE-TRIMETHOPRIM 800-160 MG PO TABS: 1.0000 | ORAL_TABLET | Freq: Two times a day (BID) | ORAL | 0 refills | Status: DC

## 2017-01-13 MED ORDER — IBUPROFEN 600 MG PO TABS
600.0000 mg | ORAL_TABLET | Freq: Once | ORAL | Status: AC
  Administered 2017-01-13: 600 mg via ORAL
  Filled 2017-01-13: qty 1

## 2017-01-13 MED ORDER — SULFAMETHOXAZOLE-TRIMETHOPRIM 800-160 MG PO TABS
1.0000 | ORAL_TABLET | Freq: Once | ORAL | Status: AC
Start: 2017-01-13 — End: 2017-01-13
  Administered 2017-01-13: 1 via ORAL
  Filled 2017-01-13: qty 1

## 2017-01-13 MED ORDER — IBUPROFEN 600 MG PO TABS: 600.0000 mg | ORAL_TABLET | Freq: Three times a day (TID) | ORAL | 0 refills | Status: DC | PRN

## 2017-01-13 MED ORDER — TRAMADOL HCL 50 MG PO TABS
50.0000 mg | ORAL_TABLET | Freq: Once | ORAL | Status: AC
  Administered 2017-01-13: 50 mg via ORAL
  Filled 2017-01-13: qty 1

## 2017-01-13 NOTE — ED Triage Notes (Signed)
Pt reports a spider bite today on left calf, no swelling or redness noted

## 2017-01-13 NOTE — ED Provider Notes (Signed)
[facility] Emergency Department Provider Note   ____________________________________________   First MD Initiated Contact with Patient 01/13/17 2051     (approximate)  I have reviewed the triage vital signs and the nursing notes.   HISTORY  Chief Complaint No chief complaint on file.    HPI Peter Cisneros is a 26 y.o. male patient complaining of pain left lower leg secondary to insect bite. Patient state he suspect a spiders due to the rapid spread of redness and pain to the left lower leg. No palliative measures for complaint. Incident occurred this afternoon.patient rates his pain as a 10 over 10. Patient described a pain as "sharp".   Past Medical History:  Diagnosis Date  . Bipolar 1 disorder (HCC)   . Depression   . Schizophrenia [facility])     Patient Active Problem List   Diagnosis Date Noted  . Benzodiazepine abuse 03/04/2016  . Substance induced mood disorder (HCC) 03/04/2016  . Anxiety disorder 02/26/2016  . Alcohol use disorder, mild, abuse 02/26/2016  . Tobacco use disorder 02/26/2016    No past surgical history on file.  Prior to Admission medications   Medication Sig Start Date End Date Taking? Authorizing Provider  clotrimazole-betamethasone (LOTRISONE) cream Apply to left forearm rash 2 times for one week 12/22/16   Renford Dills, NP  ibuprofen (ADVIL,MOTRIN) 200 MG tablet Take 200 mg by mouth every 6 (six) hours as needed for fever, headache or moderate pain.    [provider]  ibuprofen (ADVIL,MOTRIN) 600 MG tablet Take 1 tablet (600 mg total) by mouth every 8 (eight) hours as needed. 01/13/17   Joni Reining, PA-C  meloxicam (MOBIC) 15 MG tablet Take 1 tablet (15 mg total) by mouth daily as needed. 12/22/16   Renford Dills, NP  sulfamethoxazole-trimethoprim (BACTRIM DS,SEPTRA DS) 800-160 MG tablet Take 1 tablet by mouth 2 (two) times daily. 01/13/17   Joni Reining, PA-C  traMADol (ULTRAM) 50 MG tablet Take 1  tablet (50 mg total) by mouth every 12 (twelve) hours as needed. 01/13/17   Joni Reining, PA-C    Allergies Poison ivy extract [poison ivy extract]  No family history on file.  Social History Social History  Substance Use Topics  . Smoking status: Current Every Day Smoker    Packs/day: 2.00  . Smokeless tobacco: Never Used  . Alcohol use Yes    Review of Systems  Constitutional: No fever/chills Eyes: No visual changes. ENT: No sore throat. Cardiovascular: Denies chest pain. Respiratory: Denies shortness of breath. Gastrointestinal: No abdominal pain.  No nausea, no vomiting.  No diarrhea.  No constipation. Genitourinary: Negative for dysuria. Musculoskeletal: Negative for back pain. Skin: Negative for rash. Redness spreading from insect bite Neurological: Negative for headaches, focal weakness or numbness. Psychiatric:anxiety, depression, schizophrenia, and bipolar disorder.  ____________________________________________   PHYSICAL EXAM:  VITAL SIGNS: ED Triage Vitals  Enc Vitals Group     BP      Pulse      Resp      Temp      Temp src      SpO2      Weight      Height      Head Circumference      Peak Flow      Pain Score      Pain Loc      Pain Edu?      Excl. in GC?     Constitutional: Alert and oriented. Well appearing  and in no acute distress.anxious Cardiovascular: Normal rate, regular rhythm. Grossly normal heart sounds.  Good peripheral circulation. Respiratory: Normal respiratory effort.  No retractions. Lungs CTAB. Marland Kitchen.  No joint effusions. Neurologic:  Normal speech and language. No gross focal neurologic deficits are appreciated. No gait instability. Skin:  Skin is warm, dry and intact. No rash noted. Insect puncture wound on erythematous base. Psychiatric: Mood and affect are normal. Speech and behavior are normal.  ____________________________________________   LABS (all labs ordered are listed, but only abnormal results are  displayed)  Labs Reviewed - No data to display ____________________________________________  EKG   ____________________________________________  RADIOLOGY   ____________________________________________   PROCEDURES  Procedure(s) performed: None  Procedures  Critical Care performed: No  ____________________________________________   INITIAL IMPRESSION / ASSESSMENT AND PLAN / ED COURSE  Pertinent labs & imaging results that were available during my care of the patient were reviewed by me and considered in my medical decision making (see chart for details).  Infected insect bite. Lower leg. Patient given discharge Instructions. Patient advised to follow-up with family clinic condition persists.      ____________________________________________   FINAL CLINICAL IMPRESSION(S) / ED DIAGNOSES  Final diagnoses:  Insect bite of left leg, initial encounter      NEW MEDICATIONS STARTED DURING THIS VISIT:  New Prescriptions   IBUPROFEN (ADVIL,MOTRIN) 600 MG TABLET    Take 1 tablet (600 mg total) by mouth every 8 (eight) hours as needed.   SULFAMETHOXAZOLE-TRIMETHOPRIM (BACTRIM DS,SEPTRA DS) 800-160 MG TABLET    Take 1 tablet by mouth 2 (two) times daily.   TRAMADOL (ULTRAM) 50 MG TABLET    Take 1 tablet (50 mg total) by mouth every 12 (twelve) hours as needed.     Note:  This document was prepared using Dragon voice recognition software and may include unintentional dictation errors.    Joni ReiningSmith, Luciel Brickman K, PA-C 01/13/17 2100    Willy Eddyobinson, Patrick, MD 01/13/17 [phone]

## 2017-01-13 NOTE — ED Notes (Signed)
Spider bite to left thigh, no swelling noted

## 2017-03-11 ENCOUNTER — Ambulatory Visit (INDEPENDENT_AMBULATORY_CARE_PROVIDER_SITE_OTHER): Payer: BLUE CROSS/BLUE SHIELD

## 2017-03-11 ENCOUNTER — Ambulatory Visit
Admission: EM | Admit: 2017-03-11 | Discharge: 2017-03-11 | Disposition: A | Discharge status: Home / Self Care | Payer: BLUE CROSS/BLUE SHIELD | Attending: Family Medicine | Admitting: Family Medicine

## 2017-03-11 ENCOUNTER — Encounter: Payer: Self-pay | Admitting: Emergency Medicine

## 2017-03-11 DIAGNOSIS — S8001XA Contusion of right knee, initial encounter: Secondary | ICD-10-CM | POA: Diagnosis not present

## 2017-03-11 DIAGNOSIS — M25561 Pain in right knee: Secondary | ICD-10-CM | POA: Diagnosis not present

## 2017-03-11 MED ORDER — MELOXICAM 15 MG PO TABS: 15.0000 mg | ORAL_TABLET | Freq: Every day | ORAL | 0 refills | Status: DC

## 2017-03-11 NOTE — ED Provider Notes (Signed)
[facility]    CSN: [id number] Arrival date & time: 03/11/17  0836     History   Chief Complaint Chief Complaint  Patient presents with  . Knee Pain    right    HPI Peter Cisneros is a 26 y.o. male.   Patient reports yesterday are moving some material from a truck as he was walking his left leg got caught on some vitamins tripping him causing him to fall as he fell lateral aspect of his right knee hit the bed of the truck causing significant amount of pain tenderness this morning he has some swelling and increased pain so came in to be seen and evaluated. He wants make sure that he did not break anything since he has broken the tibia bone on that knee as have multiple injuries contusions and ligament damage that knee before he states that ultimately he normally cannot tell any difference to the 2 knees but that knee definite cause problems with his gait and with his walking on a regular basis before he injured it yesterday. He does smoke. He is allergic to poison ivy. History of basal days gram abuse and alcohol disorder. Other than the knee surgery denies any other surgeries or operations that denies any chronic medical problems. No pertinent family medical history   The history is provided by the patient and a significant other. No language interpreter was used.  Knee Pain  Location:  Knee Time since incident:  18 hours Injury: yes   Knee location:  L knee Pain details:    Quality:  Throbbing and sharp   Radiates to:  Does not radiate   Severity:  Moderate   Onset quality:  Sudden   Timing:  Constant   Progression:  Worsening Chronicity:  New Dislocation: no   Foreign body present:  No foreign bodies Prior injury to area:  Yes Relieved by:  Nothing   Past Medical History:  Diagnosis Date  . Bipolar 1 disorder (HCC)   . Depression   . Schizophrenia [facility])     Patient Active Problem List   Diagnosis Date Noted  . Benzodiazepine abuse 03/04/2016  .  Substance induced mood disorder (HCC) 03/04/2016  . Anxiety disorder 02/26/2016  . Alcohol use disorder, mild, abuse 02/26/2016  . Tobacco use disorder 02/26/2016    History reviewed. No pertinent surgical history.     Home Medications    Prior to Admission medications   Medication Sig Start Date End Date Taking? Authorizing Provider  meloxicam (MOBIC) 15 MG tablet Take 1 tablet (15 mg total) by mouth daily. 03/11/17   Hassan Rowan, MD    Family History History reviewed. No pertinent family history.  Social History Social History  Substance Use Topics  . Smoking status: Current Every Day Smoker    Packs/day: 2.00  . Smokeless tobacco: Never Used  . Alcohol use Yes     Allergies   Poison ivy extract [poison ivy extract]   Review of Systems Review of Systems  Constitutional: Negative for activity change and appetite change.  Musculoskeletal: Positive for gait problem, joint swelling and myalgias.  All other systems reviewed and are negative.    Physical Exam Triage Vital Signs ED Triage Vitals  Enc Vitals Group     BP 03/11/17 0846 128/75     Pulse Rate 03/11/17 0846 62     Resp 03/11/17 0846 16     Temp 03/11/17 0846 98.5 F (36.9 C)     Temp Source  03/11/17 0846 Oral     SpO2 03/11/17 0846 100 %     Weight 03/11/17 0845 135 lb (61.2 kg)     Height 03/11/17 0845 5\' 11"  (1.803 m)     Head Circumference --      Peak Flow --      Pain Score 03/11/17 0845 7     Pain Loc --      Pain Edu? --      Excl. in GC? --    No data found.   Updated Vital Signs BP 128/75 (BP Location: Left Arm)   Pulse 62   Temp 98.5 F (36.9 C) (Oral)   Resp 16   Ht 5\' 11"  (1.803 m)   Wt 135 lb (61.2 kg)   SpO2 100%   BMI 18.83 kg/m   Visual Acuity Right Eye Distance:   Left Eye Distance:   Bilateral Distance:    Right Eye Near:   Left Eye Near:    Bilateral Near:     Physical Exam  Constitutional: He is oriented to person, place, and time. He appears  well-developed and well-nourished. No distress.  HENT:  Head: Normocephalic and atraumatic.  Right Ear: External ear normal.  Left Ear: External ear normal.  Mouth/Throat: Oropharynx is clear and moist.  Eyes: Pupils are equal, round, and reactive to light.  Neck: Normal range of motion. Neck supple.  Musculoskeletal: He exhibits edema and tenderness.       Right knee: He exhibits swelling and effusion. Tenderness found.       Legs: A short swelling of the lateral aspect of the right knee very tender to palpation at the knee joint appears be stable  Neurological: He is alert and oriented to person, place, and time.  Skin: Skin is warm. He is not diaphoretic.  Psychiatric: He has a normal mood and affect.  Vitals reviewed.    UC Treatments / Results  Labs (all labs ordered are listed, but only abnormal results are displayed) Labs Reviewed - No data to display  EKG  EKG Interpretation None       Radiology Dg Knee Complete 4 Views Right  Result Date: 03/11/2017 CLINICAL DATA:  Right knee pain and swelling due to injury yesterday. Initial encounter. EXAM: RIGHT KNEE - COMPLETE 4+ VIEW COMPARISON:  MRI 10/11/2015 FINDINGS: No evidence of fracture, dislocation, or joint effusion. No evidence of arthropathy or other focal bone abnormality. Soft tissues are unremarkable. IMPRESSION: Negative. Electronically Signed   By: Marnee Spring M.D.   On: 03/11/2017 09:20    Procedures Procedures (including critical care time)  Medications Ordered in UC Medications - No data to display   Initial Impression / Assessment and Plan / UC Course  I have reviewed the triage vital signs and the nursing notes.  Pertinent labs & imaging results that were available during my care of the patient were reviewed by me and considered in my medical decision making (see chart for details).    X-ray of the right knee was negative place, with 15 mg work note for today and tomorrow follow-up with PCP of  his choice if not better  Final Clinical Impressions(s) / UC Diagnoses   Final diagnoses:  Acute pain of right knee  Contusion of right knee, initial encounter    New Prescriptions New Prescriptions   MELOXICAM (MOBIC) 15 MG TABLET    Take 1 tablet (15 mg total) by mouth daily.    Note: This dictation was prepared with Dragon dictation  along with smaller phrase technology. Any transcriptional errors that result from this process are unintentional.   Fernande Treiber, EugeneHassan Rowan, MD 03/11/17 [phone]

## 2017-03-11 NOTE — ED Triage Notes (Signed)
Patient c/o right knee pain.  Patient  States that he hit his knee on the side of his truck yesterday.

## 2017-03-23 ENCOUNTER — Ambulatory Visit
Admission: EM | Admit: 2017-03-23 | Discharge: 2017-03-23 | Disposition: A | Discharge status: Home / Self Care | Payer: BLUE CROSS/BLUE SHIELD | Attending: Family | Admitting: Family

## 2017-03-23 DIAGNOSIS — K047 Periapical abscess without sinus: Secondary | ICD-10-CM

## 2017-03-23 DIAGNOSIS — J029 Acute pharyngitis, unspecified: Secondary | ICD-10-CM | POA: Diagnosis not present

## 2017-03-23 LAB — RAPID STREP SCREEN (MED CTR MEBANE ONLY): Streptococcus, Group A Screen (Direct): NEGATIVE

## 2017-03-23 MED ORDER — AMOXICILLIN-POT CLAVULANATE 875-125 MG PO TABS: 1.0000 | ORAL_TABLET | Freq: Two times a day (BID) | ORAL | 0 refills | Status: AC

## 2017-03-23 MED ORDER — DICLOFENAC SODIUM 75 MG PO TBEC: 75.0000 mg | DELAYED_RELEASE_TABLET | Freq: Two times a day (BID) | ORAL | 0 refills | Status: DC | PRN

## 2017-03-23 NOTE — ED Triage Notes (Signed)
26 year old Caucasian male is here today with complaints of tooth pain and a sore throat that started last night. He states his wisdom have come in, which is where the pain is. He states he does not have insurance to go to a dentist. He states he has gargled with warm salt water and taken OTC Tylenol and Ibuprofen with no relief.

## 2017-03-23 NOTE — Discharge Instructions (Signed)
Recommend start Augmentin 875mg  twice a day as directed. Use Voltaren 75mg  twice a day as directed for pain and swelling. Use cool compresses to area for comfort. Recommend follow-up with a dentist within 1 week if not improving.

## 2017-03-23 NOTE — ED Provider Notes (Signed)
CSN: [id number]     Arrival date & time 03/23/17  1743 History   First MD Initiated Contact with Patient 03/23/17 1814     Chief Complaint  Patient presents with  . Dental Pain  . Sore Throat   (Consider location/radiation/quality/duration/timing/severity/associated sxs/prior Treatment) 26 year old male presents with left sided throat pain and upper tooth/jaw pain that started last evening. Denies any fever, nasal congestion, cough or GI symptoms. He has tried Ibuprofen, Mobic and Tylenol with no relief. He has also been prescribed Tramadol in the past with minimal relief. He has not seen a dentist in many years and does not have insurance at this time to see a dentist. He does smoke. He also has a history of depression, bipolar disorder and alcohol abuse- he is not on any medication at this time.    The history is provided by the patient.  Sore Throat  Associated symptoms include headaches. Pertinent negatives include no shortness of breath.    Past Medical History:  Diagnosis Date  . Bipolar 1 disorder (HCC)   . Depression   . Schizophrenia (HCC)    History reviewed. No pertinent surgical history. Family History  Problem Relation Age of Onset  . Hypertension Mother   . Bipolar disorder Father    Social History  Substance Use Topics  . Smoking status: Current Every Day Smoker    Packs/day: 2.00  . Smokeless tobacco: Current User  . Alcohol use Yes    Review of Systems  Constitutional: Negative for appetite change, chills and fever.  HENT: Positive for dental problem and sore throat. Negative for congestion, ear discharge, facial swelling, mouth sores, postnasal drip, rhinorrhea, sinus pain and sinus pressure.   Eyes: Negative for discharge and itching.  Respiratory: Negative for cough, chest tightness, shortness of breath and wheezing.   Gastrointestinal: Negative for diarrhea, nausea and vomiting.  Musculoskeletal: Positive for arthralgias.  Skin: Negative for rash and  wound.  Neurological: Positive for headaches. Negative for dizziness, syncope, facial asymmetry, weakness, light-headedness and numbness.  Hematological: Positive for adenopathy.    Allergies  Poison ivy extract [poison ivy extract]  Home Medications   Prior to Admission medications   Medication Sig Start Date End Date Taking? Authorizing Provider  amoxicillin-clavulanate (AUGMENTIN) 875-125 MG tablet Take 1 tablet by mouth every 12 (twelve) hours. 03/23/17 04/02/17  Sudie GrumblingAmyot, Mohamadou Maciver Berry, NP  diclofenac (VOLTAREN) 75 MG EC tablet Take 1 tablet (75 mg total) by mouth 2 (two) times daily as needed for moderate pain. 03/23/17   Sudie GrumblingAmyot, Maigan Bittinger Berry, NP   Meds Ordered and Administered this Visit  Medications - No data to display  BP 135/83 (BP Location: Left Arm)   Pulse (!) 101   Temp 99 F (37.2 C) (Oral)   Resp 18   Ht 5\' 11"  (1.803 m)   Wt 140 lb (63.5 kg)   SpO2 100%   BMI 19.53 kg/m  No data found.   Physical Exam  Constitutional: He is oriented to person, place, and time. He appears well-developed and well-nourished. No distress.  HENT:  Head: Normocephalic and atraumatic.  Right Ear: Hearing, tympanic membrane, external ear and ear canal normal.  Left Ear: Hearing, tympanic membrane, external ear and ear canal normal.  Nose: Nose normal.  Mouth/Throat: Mucous membranes are normal. Dental abscesses (left upper last molar) and dental caries present. Posterior oropharyngeal erythema (left side only) present. No oropharyngeal exudate, posterior oropharyngeal edema or tonsillar abscesses. Tonsils are 1+ on the right. Tonsils are  1+ on the left. No tonsillar exudate.    Redness, swelling and tender along posterior and lateral aspect of left upper wisdom tooth. No distinct discharge.   Eyes: Conjunctivae and EOM are normal.  Neck: Normal range of motion. Neck supple.  Cardiovascular: Normal rate, regular rhythm and normal heart sounds.   No murmur heard. Pulmonary/Chest: Effort normal  and breath sounds normal. No respiratory distress.  Musculoskeletal: Normal range of motion.  Lymphadenopathy:       Head (left side): Tonsillar adenopathy present. No submental, no submandibular, no preauricular, no posterior auricular and no occipital adenopathy present.    He has cervical adenopathy.       Right cervical: No superficial cervical and no deep cervical adenopathy present.      Left cervical: Superficial cervical and deep cervical adenopathy present.  Neurological: He is alert and oriented to person, place, and time.  Skin: Skin is warm and dry. Capillary refill takes less than 2 seconds. No rash noted.  Psychiatric: He has a normal mood and affect. His speech is normal. Judgment and thought content normal. He is slowed. Cognition and memory are normal.    Urgent Care Course     Procedures (including critical care time)  Labs Review Labs Reviewed  RAPID STREP SCREEN (NOT [facility])  CULTURE, GROUP A STREP [facility])    Imaging Review No results found.   Visual Acuity Review  Right Eye Distance:   Left Eye Distance:   Bilateral Distance:    Right Eye Near:   Left Eye Near:    Bilateral Near:         MDM   1. Dental infection   2. Sore throat    Reviewed negative rapid strep test with patient. Discussed that he appears to have an infection/abscess of left upper molar. Recommend start Augmentin 875mg  twice a day as directed. Recommend Voltaren 75mg  twice a day for pain and swelling. Offered him a few doses of Tramadol for pain but patient denied. Due to history of depression and episode of taking too much anti-anxiety medication in July 2017, consulted [place] Controlled Substance Registry. Only active Rx in the past 6 months was Tramadol in May 2018. Patient asked for another controlled pain medication besides Tramadol but I discussed that the pain and swelling would be better managed with antibiotics and anti-inflammatories. Encouraged to use cool compresses to area  for comfort. Note written for work. Recommend follow-up with a local dentist within 1 week if not improving and for routine dental care.     Sudie GrumblingAmyot, Humbert Morozov Berry, NP 03/24/17 0201

## 2017-03-26 LAB — CULTURE, GROUP A STREP (THRC)

## 2017-08-12 IMAGING — MR MR KNEE*R* W/O CM
6 series · 36 of 40 positions shown · non-contrast
Comparison: Plain films the right knee 11/25/2009.

CLINICAL DATA: Chronic right knee pain. History of prior
dislocations. Knee locking. Initial encounter.

EXAM:
MRI OF THE RIGHT KNEE WITHOUT CONTRAST
TECHNIQUE: Multiplanar, multisequence MR imaging of the knee was performed. No
intravenous contrast was administered.

[Series 3: PD fat-sat · axial · 3.0mm · 0.50mm/px · z∈[-62,+57]mm · 9 of 37 slices shown (1 of 4)]
[im 1/37]
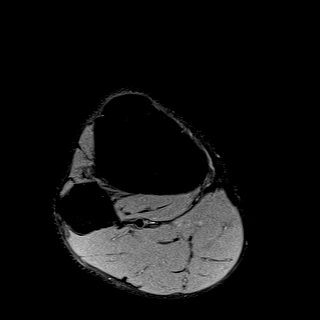
[im 5/37]
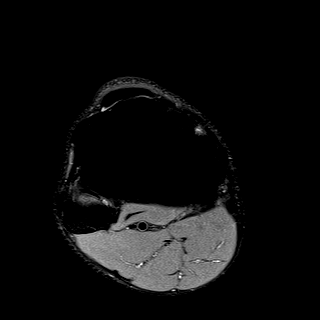
[im 10/37]
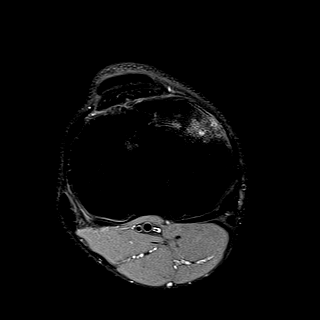
[im 14/37]
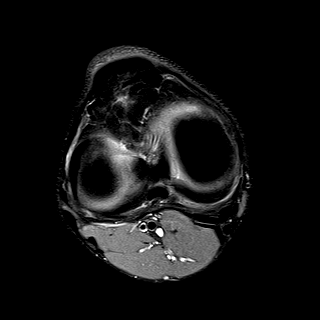
[im 19/37]
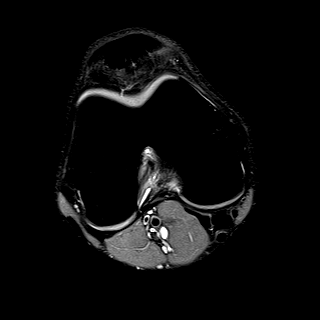
[im 23/37]
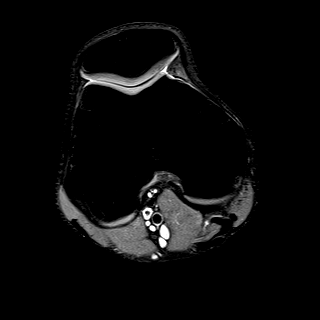
[im 28/37]
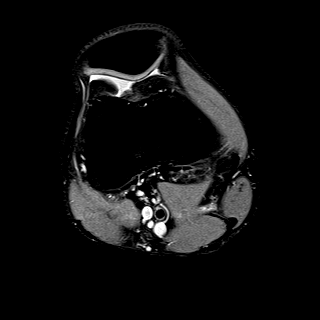
[im 32/37]
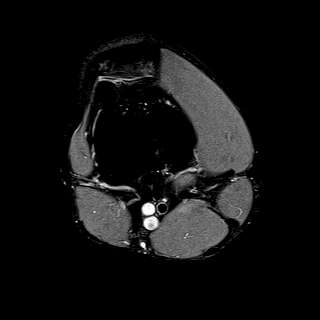
[im 37/37]
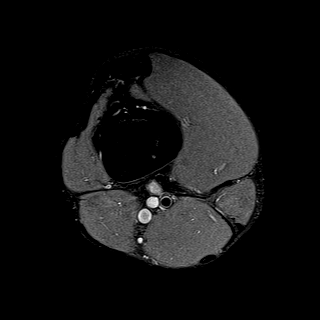

[Series 4: PD fat-sat · sagittal · 3.0mm · 0.50mm/px · 7 of 32 slices shown (2 of 4)]
[im 1/32]
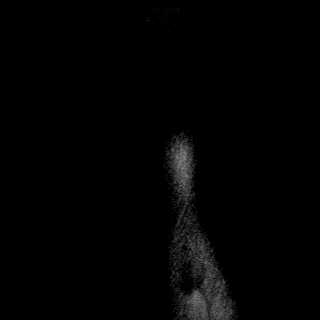
[im 6/32]
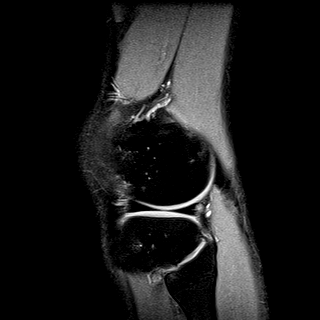
[im 11/32]
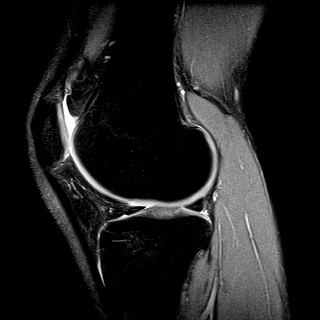
[im 16/32]
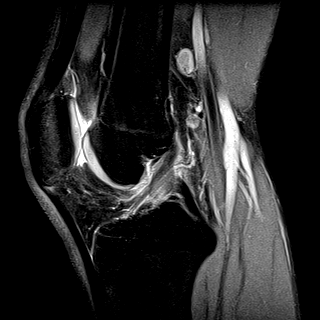
[im 21/32]
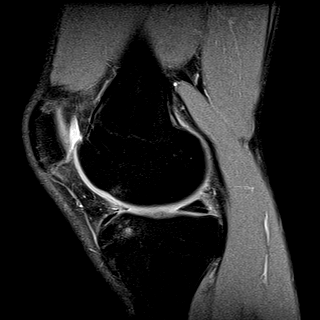
[im 26/32]
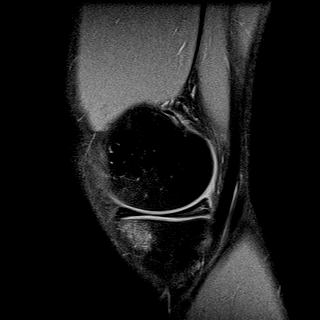
[im 32/32]
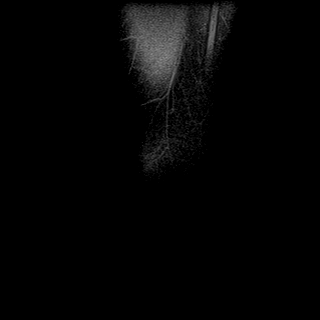

[Series 5: T1 · coronal · 3.0mm · 0.50mm/px · 3 of 34 slices shown]
[im 1/34]
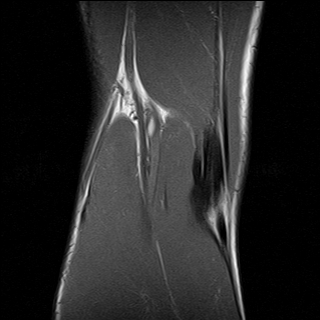
[im 6/34]
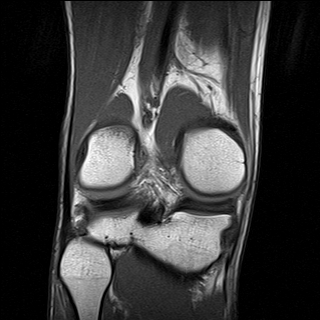
[im 12/34]
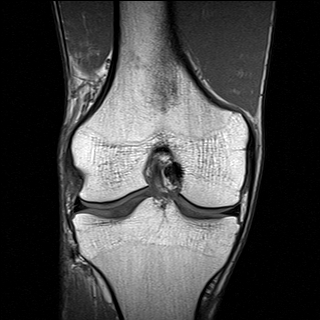

[Series 6: T2 fat-sat · coronal · 3.0mm · 0.31mm/px · 7 of 34 slices shown]
[im 1/34]
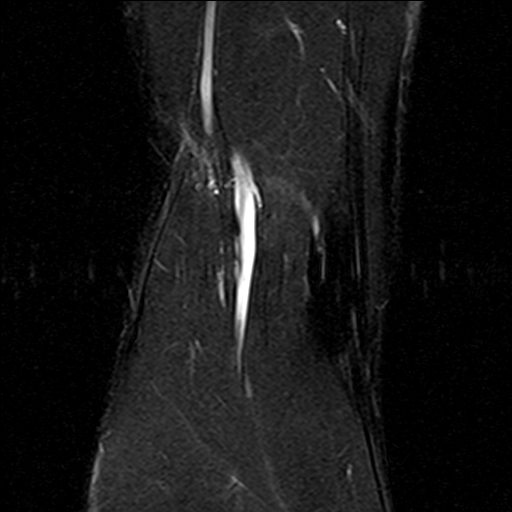
[im 6/34]
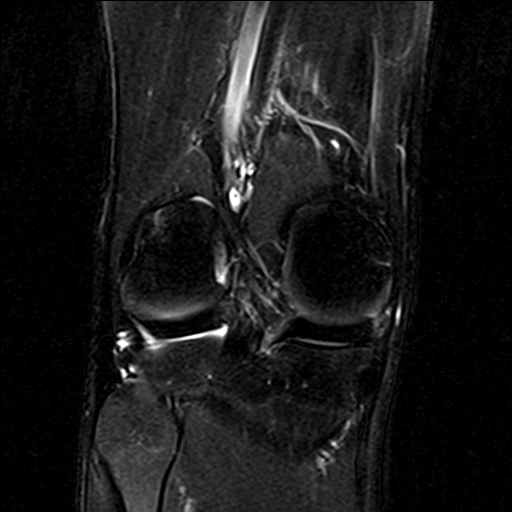
[im 12/34]
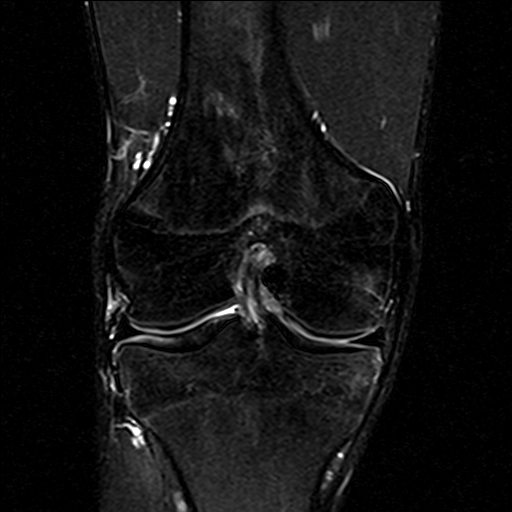
[im 17/34]
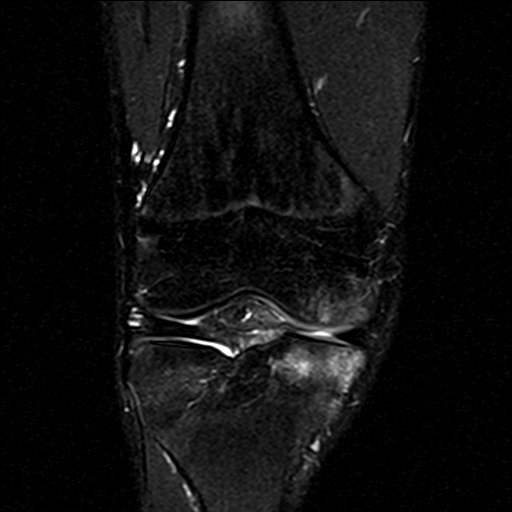
[im 23/34]
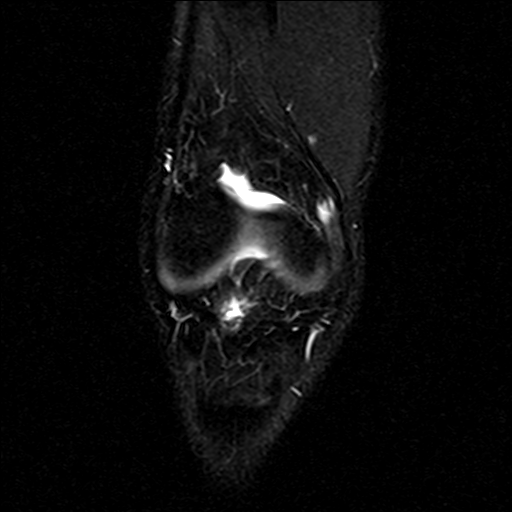
[im 28/34]
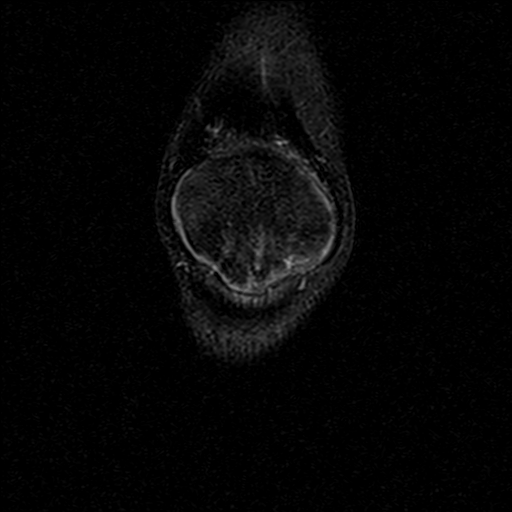
[im 34/34]
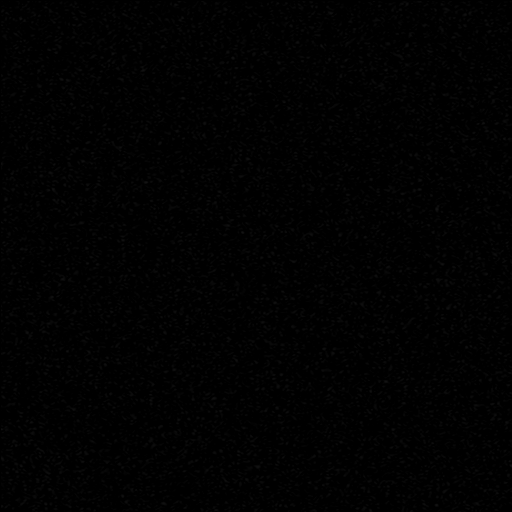

[Series 7: PD fat-sat · coronal · 3.0mm · 0.50mm/px · 7 of 34 slices shown (3 of 4)]
[im 1/34]
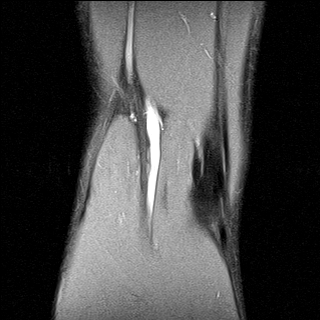
[im 6/34]
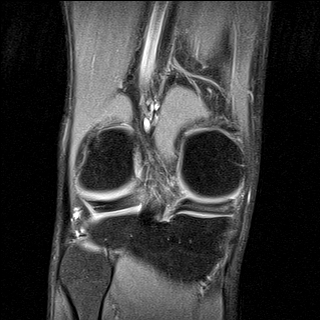
[im 12/34]
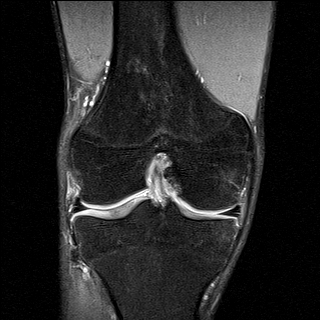
[im 17/34]
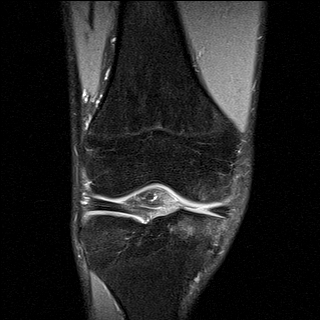
[im 23/34]
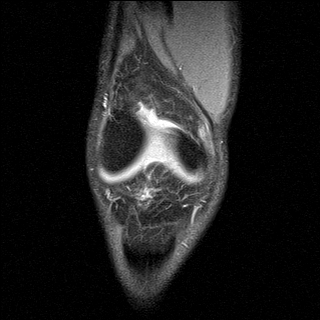
[im 28/34]
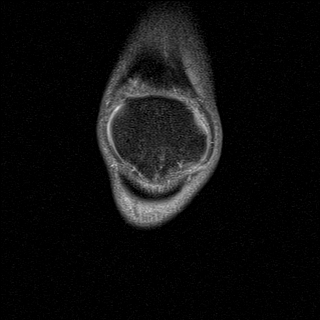
[im 34/34]
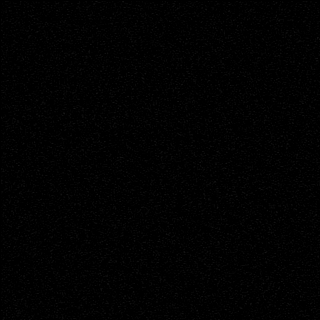

[Series 8: PD fat-sat · oblique · 2.0mm · 0.62mm/px · 3 of 13 slices shown (4 of 4)]
[im 1/13]
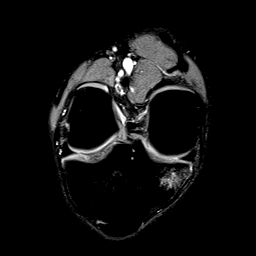
[im 7/13]
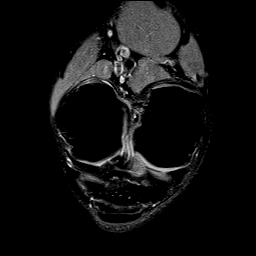
[im 13/13]
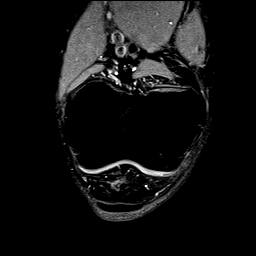

[36 of 40 positions shown; findings below may reference images not displayed]

FINDINGS: MENISCI

Medial meniscus: There is some linear increased T2 signal in the
posterior horn of the medial meniscus but no meniscal tear is
identified.

Lateral meniscus:  Intact.

LIGAMENTS

Cruciates:  Intact.

Collaterals:  Intact.

CARTILAGE

Patellofemoral:  Normal.

Medial: There is a focal defect in hyaline cartilage along the
anterior aspect of the medial tibial plateau measuring approximately
0.3 cm AP by 0.2 cm transverse. Mild thinning of hyaline cartilage
is seen along the anterior weight-bearing surface of the medial
femoral trochlea.

Lateral:  Unremarkable.

Joint:  Trace amount of joint fluid is present.

Popliteal Fossa:  No Baker cyst

Extensor Mechanism:  Intact.

Bones: No fracture or worrisome marrow lesion. Marrow edema is seen
subjacent to the cartilage defects of the medial compartment, more
notable in the tibia.
IMPRESSION: Negative for meniscal or ligament tear. No evidence of prior knee
dislocation.

Focal full-thickness cartilage defect in the anterior medial tibial
plateau with underlying reactive marrow edema. Mild cartilage
thinning along the anterior weight-bearing surface of the medial
femoral condyle with associated mild edema also noted.

## 2017-08-12 IMAGING — MR MR KNEE*L* W/O CM
6 series · 40 of 40 positions shown · non-contrast
Comparison: None.

CLINICAL DATA: History of prior knee dislocations, torn ligaments
and a left knee fracture. Left knee pain for 5 years. Initial
encounter.

EXAM:
MRI OF THE LEFT KNEE WITHOUT CONTRAST
TECHNIQUE: Multiplanar, multisequence MR imaging of the knee was performed. No
intravenous contrast was administered.

[Series 3: PD fat-sat · axial · 3.0mm · 0.62mm/px · z∈[-68,+51]mm · 9 of 37 slices shown (1 of 4)]
[im 1/37]
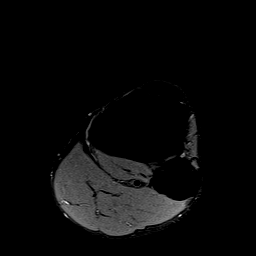
[im 5/37]
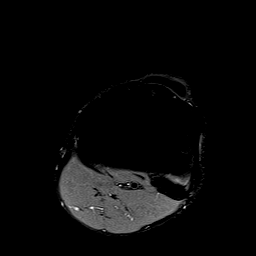
[im 10/37]
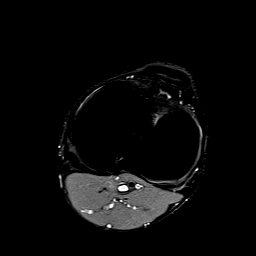
[im 14/37]
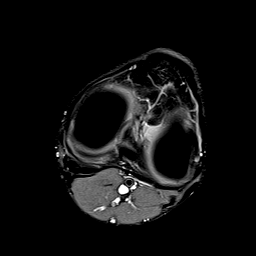
[im 19/37]
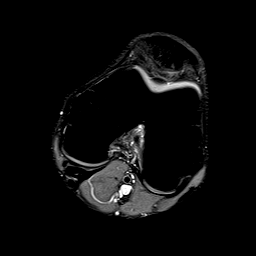
[im 23/37]
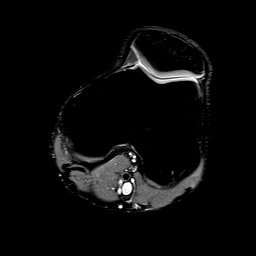
[im 28/37]
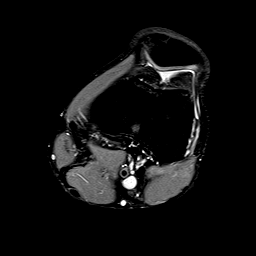
[im 32/37]
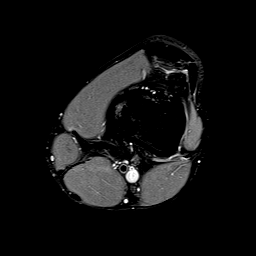
[im 37/37]
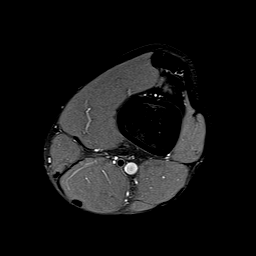

[Series 4: T1 · coronal · 3.0mm · 0.62mm/px · 7 of 34 slices shown]
[im 1/34]
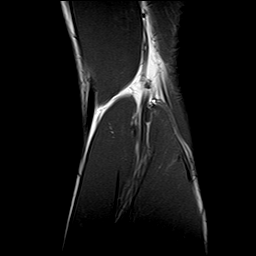
[im 6/34]
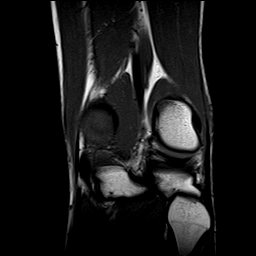
[im 12/34]
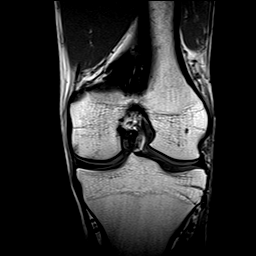
[im 17/34]
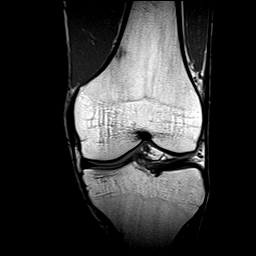
[im 23/34]
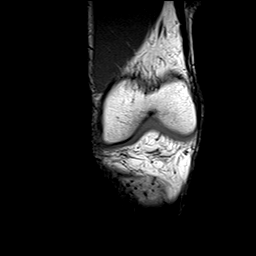
[im 28/34]
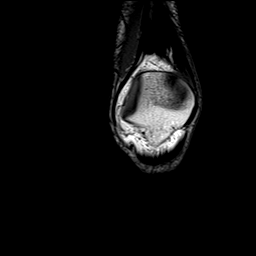
[im 34/34]
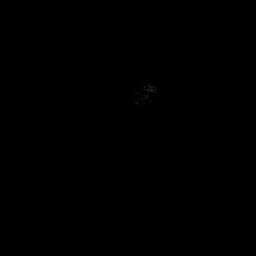

[Series 5: T2 fat-sat · coronal · 3.0mm · 0.31mm/px · 7 of 34 slices shown]
[im 1/34]
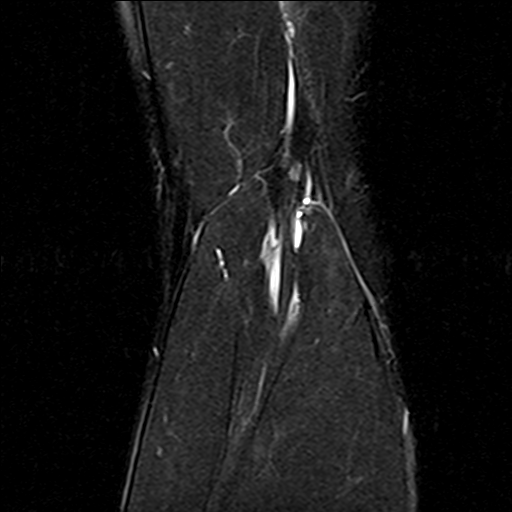
[im 6/34]
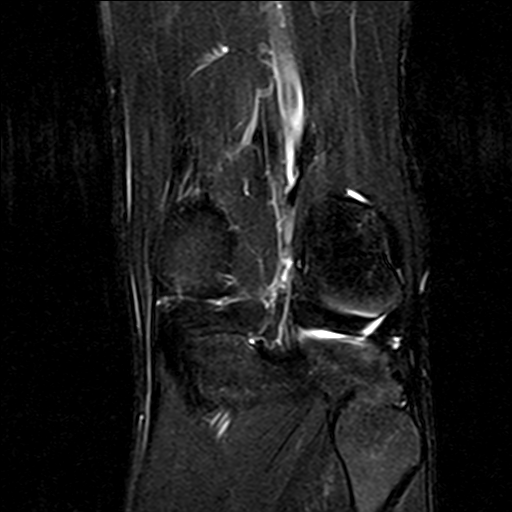
[im 12/34]
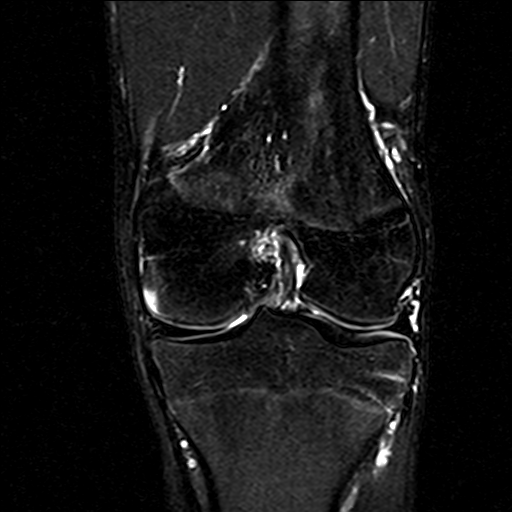
[im 17/34]
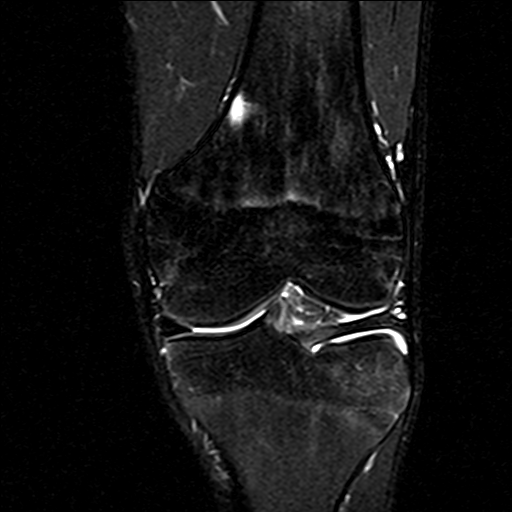
[im 23/34]
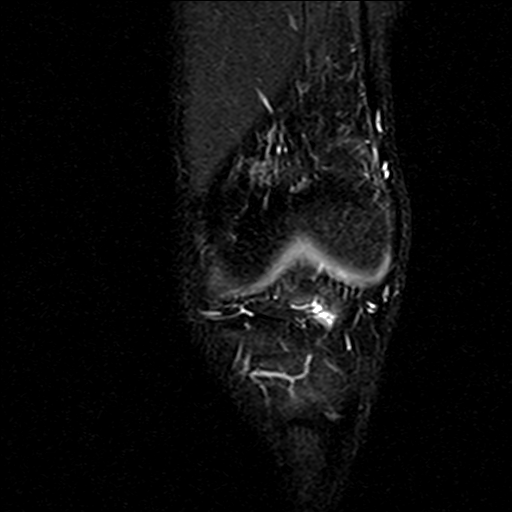
[im 28/34]
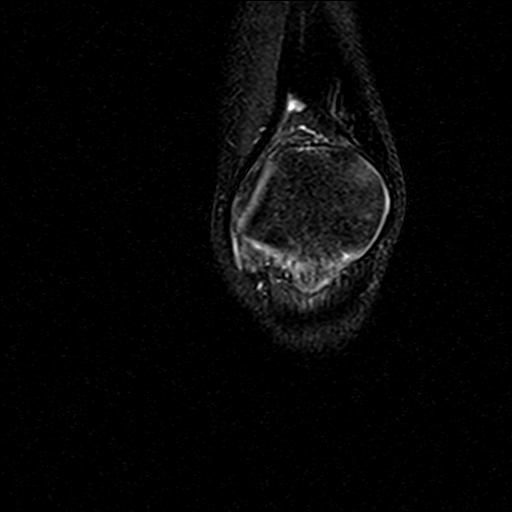
[im 34/34]
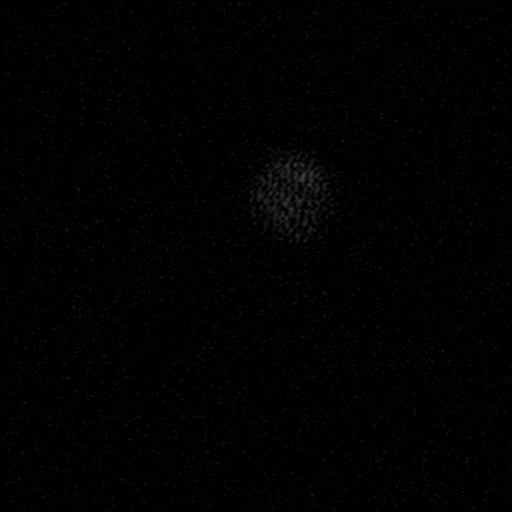

[Series 6: PD fat-sat · coronal · 3.0mm · 0.62mm/px · 7 of 34 slices shown (2 of 4)]
[im 1/34]
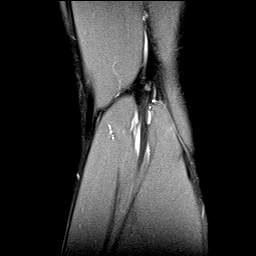
[im 6/34]
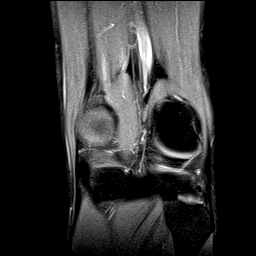
[im 12/34]
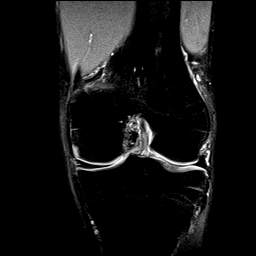
[im 17/34]
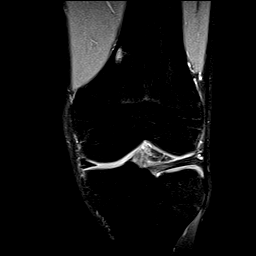
[im 23/34]
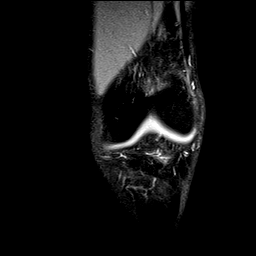
[im 28/34]
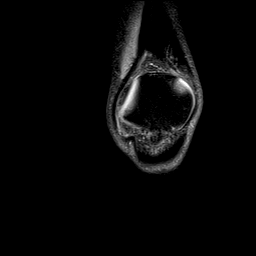
[im 34/34]
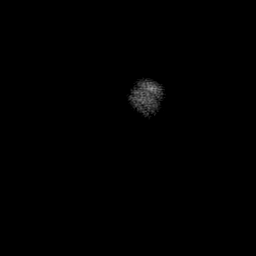

[Series 7: PD fat-sat · sagittal · 3.0mm · 0.62mm/px · 7 of 32 slices shown (3 of 4)]
[im 1/32]
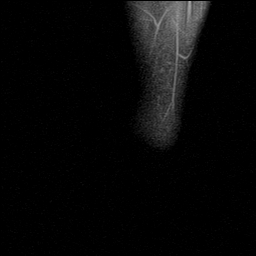
[im 6/32]
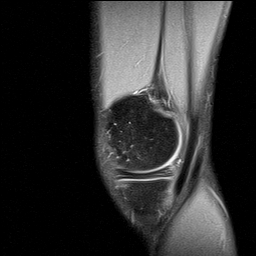
[im 11/32]
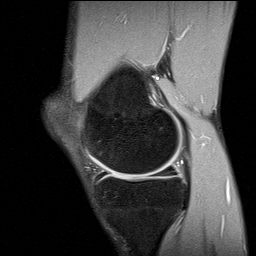
[im 16/32]
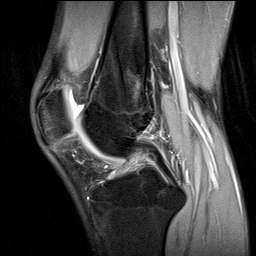
[im 21/32]
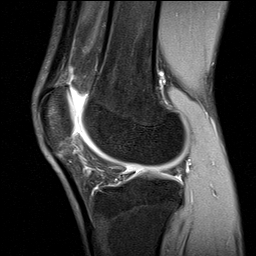
[im 26/32]
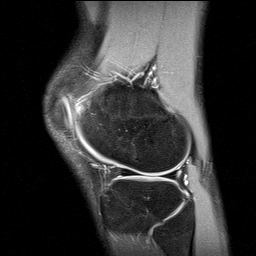
[im 32/32]
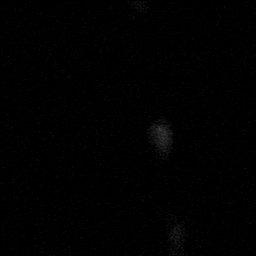

[Series 8: PD fat-sat · oblique · 2.0mm · 0.62mm/px · 3 of 13 slices shown (4 of 4)]
[im 1/13]
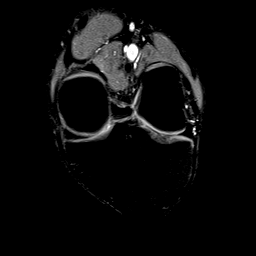
[im 7/13]
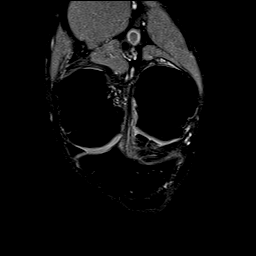
[im 13/13]
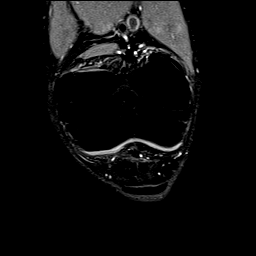

[40 of 40 positions shown; findings below may reference images not displayed]

FINDINGS: MENISCI

Medial meniscus: Mild linear increased T2 signal is seen in the
posterior horn of the medial meniscus but no tear is identified.

Lateral meniscus:  Intact.

LIGAMENTS

Cruciates:  Intact.

Collaterals:  Intact.

CARTILAGE

Patellofemoral:  Unremarkable.

Medial:  Unremarkable.

Lateral:  Unremarkable.

Joint:  No joint effusion.

Popliteal Fossa:  No Baker's cyst.

Extensor Mechanism:  Intact.

Bones: A 0.8 by 0.5 by 0.4 cm subcortical focus of mild increased T2
signal in the bone marrow and may represent a small focus of active
marrow or a tiny cyst. Bone marrow signal is otherwise normal.
IMPRESSION: Negative for meniscal or ligament tear. No finding to explain the
patient's symptoms.

## 2018-09-30 ENCOUNTER — Encounter: Payer: Self-pay | Admitting: Emergency Medicine

## 2018-09-30 ENCOUNTER — Other Ambulatory Visit: Payer: Self-pay

## 2018-09-30 ENCOUNTER — Ambulatory Visit
Admission: EM | Admit: 2018-09-30 | Discharge: 2018-09-30 | Disposition: A | Discharge status: Home / Self Care | Payer: BLUE CROSS/BLUE SHIELD | Attending: Family Medicine | Admitting: Family Medicine

## 2018-09-30 DIAGNOSIS — R42 Dizziness and giddiness: Secondary | ICD-10-CM

## 2018-09-30 DIAGNOSIS — R55 Syncope and collapse: Secondary | ICD-10-CM | POA: Diagnosis not present

## 2018-09-30 NOTE — Discharge Instructions (Signed)
Rest. Fluids.  If recurs, you will need additional work up and likely neuro eval.  Take care  Dr. Adriana Simas

## 2018-09-30 NOTE — ED Provider Notes (Signed)
[facility]    CSN: [id number] Arrival date & time: 09/30/18  1236  History   Chief Complaint Chief Complaint  Patient presents with  . Dizziness    APPT   HPI  28 year old male presents with dizziness.  Patient has difficulty describing his symptoms.  He states that yesterday he was sitting in his work truck.  He states that he felt like he was experiencing "dj vu".  However, he states that it was very severe.  He states that he became dizzy and felt hot.  He states that he subsequently became paranoid.  He states that he called his family members to ensure that everything was okay.  He states that it lasted for a while and then resolved.  He states that he feels fine currently.  He does feel fatigued but otherwise feels well.  He has had no other similar episodes.  No reports of vomiting.  He did have some nausea.  No other associated symptoms.  No other complaints or concerns at this time.  PMH, Surgical Hx, Family Hx, Social History reviewed and updated as below.  Past Medical History:  Diagnosis Date  . Bipolar 1 disorder (HCC)   . Depression   . Schizophrenia [facility](HCC)    Patient Active Problem List   Diagnosis Date Noted  . Benzodiazepine abuse (HCC) 03/04/2016  . Substance induced mood disorder (HCC) 03/04/2016  . Anxiety disorder 02/26/2016  . Alcohol use disorder, mild, abuse 02/26/2016  . Tobacco use disorder 02/26/2016   Past Surgical History:  Procedure Laterality Date  . NO PAST SURGERIES     Home Medications    Prior to Admission medications   Not on File   Family History Family History  Problem Relation Age of Onset  . Hypertension Mother   . Bipolar disorder Father    Social History Social History   Tobacco Use  . Smoking status: Current Every Day Smoker    Packs/day: 2.00    Years: 10.00    Pack years: 20.00  . Smokeless tobacco: Current User  Substance Use Topics  . Alcohol use: Yes    Comment: occassional - once per month  .  Drug use: No   Allergies   Poison ivy extract [poison ivy extract]  Review of Systems Review of Systems  Constitutional: Negative for fever.  Gastrointestinal: Positive for nausea. Negative for vomiting.  Neurological: Positive for dizziness.  Psychiatric/Behavioral:       Paranoia.    Physical Exam Triage Vital Signs ED Triage Vitals  Enc Vitals Group     BP 09/30/18 1310 122/80     Pulse Rate 09/30/18 1310 78     Resp 09/30/18 1310 16     Temp 09/30/18 1310 99.2 F (37.3 C)     Temp Source 09/30/18 1310 Oral     SpO2 09/30/18 1310 100 %     Weight 09/30/18 1311 140 lb (63.5 kg)     Height 09/30/18 1311 5\' 11"  (1.803 m)     Head Circumference --      Peak Flow --      Pain Score 09/30/18 1311 0     Pain Loc --      Pain Edu? --      Excl. in GC? --    Updated Vital Signs BP 122/80 (BP Location: Left Arm)   Pulse 78   Temp 99.2 F (37.3 C) (Oral)   Resp 16   Ht 5\' 11"  (1.803 m)  Wt 63.5 kg   SpO2 100%   BMI 19.53 kg/m   Visual Acuity Right Eye Distance:   Left Eye Distance:   Bilateral Distance:    Right Eye Near:   Left Eye Near:    Bilateral Near:     Physical Exam Vitals signs and nursing note reviewed.  Constitutional:      General: He is not in acute distress.    Appearance: Normal appearance.  HENT:     Head: Normocephalic and atraumatic.     Mouth/Throat:     Mouth: Mucous membranes are moist.     Pharynx: Oropharynx is clear. No posterior oropharyngeal erythema.  Eyes:     General:        Right eye: No discharge.        Left eye: No discharge.     Conjunctiva/sclera: Conjunctivae normal.  Cardiovascular:     Rate and Rhythm: Normal rate and regular rhythm.  Pulmonary:     Effort: Pulmonary effort is normal.     Breath sounds: Normal breath sounds.  Abdominal:     General: Abdomen is flat. There is no distension.     Palpations: Abdomen is soft.     Tenderness: There is no abdominal tenderness.  Neurological:     Mental Status: He  is alert.  Psychiatric:        Mood and Affect: Mood normal.        Behavior: Behavior normal.    UC Treatments / Results  Labs (all labs ordered are listed, but only abnormal results are displayed) Labs Reviewed - No data to display  EKG None  Radiology No results found.  Procedures Procedures (including critical care time)  Medications Ordered in UC Medications - No data to display  Initial Impression / Assessment and Plan / UC Course  I have reviewed the triage vital signs and the nursing notes.  Pertinent labs & imaging results that were available during my care of the patient were reviewed by me and considered in my medical decision making (see chart for details).    28 year old male presents with a suspected vagal reaction.  Patient has difficulty describing his symptoms.  I have advised supportive care and increase hydration.  If recurs, will need further evaluation for possible male presents with neurological or psychiatric evaluation  Final Clinical Impressions(s) / UC Diagnoses   Final diagnoses:  Vagal reaction     Discharge Instructions     Rest. Fluids.  If recurs, you will need additional work up and likely neuro eval.  Take care  Dr. Adriana Simas    ED Prescriptions    None     Controlled Substance Prescriptions [place] Controlled Substance Registry consulted? Not Applicable   Tommie Sams, DO 09/30/18 1434

## 2018-09-30 NOTE — ED Triage Notes (Signed)
Patient in today stating that he was sitting in his work truck yesterday and felt like "deja vu, but 1000 times worse" and after that he felt paranoid and dizzy and got really hot.

## 2021-11-28 ENCOUNTER — Ambulatory Visit
Admission: EM | Admit: 2021-11-28 | Discharge: 2021-11-28 | Disposition: A | Discharge status: Home / Self Care | Payer: BLUE CROSS/BLUE SHIELD | Attending: Physician Assistant | Admitting: Physician Assistant

## 2021-11-28 ENCOUNTER — Other Ambulatory Visit: Payer: Self-pay

## 2021-11-28 ENCOUNTER — Encounter: Payer: Self-pay | Admitting: Emergency Medicine

## 2021-11-28 ENCOUNTER — Ambulatory Visit (INDEPENDENT_AMBULATORY_CARE_PROVIDER_SITE_OTHER): Payer: Self-pay

## 2021-11-28 DIAGNOSIS — Z20822 Contact with and (suspected) exposure to covid-19: Secondary | ICD-10-CM

## 2021-11-28 DIAGNOSIS — B349 Viral infection, unspecified: Secondary | ICD-10-CM

## 2021-11-28 DIAGNOSIS — R059 Cough, unspecified: Secondary | ICD-10-CM

## 2021-11-28 DIAGNOSIS — R051 Acute cough: Secondary | ICD-10-CM

## 2021-11-28 DIAGNOSIS — R509 Fever, unspecified: Secondary | ICD-10-CM

## 2021-11-28 DIAGNOSIS — R0602 Shortness of breath: Secondary | ICD-10-CM

## 2021-11-28 DIAGNOSIS — R062 Wheezing: Secondary | ICD-10-CM

## 2021-11-28 MED ORDER — ALBUTEROL SULFATE HFA 108 (90 BASE) MCG/ACT IN AERS: 1.0000 | INHALATION_SPRAY | RESPIRATORY_TRACT | 0 refills | Status: DC | PRN

## 2021-11-28 MED ORDER — PROMETHAZINE-DM 6.25-15 MG/5ML PO SYRP: 5.0000 mL | ORAL_SOLUTION | Freq: Four times a day (QID) | ORAL | 0 refills | Status: DC | PRN

## 2021-11-28 NOTE — ED Triage Notes (Signed)
Pt c/o cough, chest pain, low grade fever, shortness of breath. Started about 3 days ago. Spouse tested positive for covid at home. Pt declines covid testing at this time ?

## 2021-11-28 NOTE — ED Provider Notes (Signed)
?[facility] ? ? ? ?CSN: [id number] ?Arrival date & time: 11/28/21  1832 ? ? ?  ? ?History   ?Chief Complaint ?Chief Complaint  ?Patient presents with  ? Cough  ?  Entered by patient  ? ? ?HPI ?Peter Cisneros is a 31 y.o. male presenting for 3-day history of feeling ill.  Reports fever, fatigue, cough, congestion.  Today he reports pain in chest and feeling short of breath.  Reports that his wife recently tested positive for COVID-19.  He has been taking over-the-counter cough medications but says the cough seems to be worse.  Patient has no history of lung diseases.  He does smoke.  He has not been vaccinated for COVID-19.  He has not tested himself for COVID-19.  No other complaints. ? ?HPI ? ?Past Medical History:  ?Diagnosis Date  ? Bipolar 1 disorder (HCC)   ? Depression   ? Schizophrenia (HCC)   ? ? ?Patient Active Problem List  ? Diagnosis Date Noted  ? Benzodiazepine abuse (HCC) 03/04/2016  ? Substance induced mood disorder (HCC) 03/04/2016  ? Anxiety disorder 02/26/2016  ? Alcohol use disorder, mild, abuse 02/26/2016  ? Tobacco use disorder 02/26/2016  ? ? ?Past Surgical History:  ?Procedure Laterality Date  ? NO PAST SURGERIES    ? ? ? ? ? ?Home Medications   ? ?Prior to Admission medications   ?Medication Sig Start Date End Date Taking? Authorizing Provider  ?albuterol (VENTOLIN HFA) 108 (90 Base) MCG/ACT inhaler Inhale 1-2 puffs into the lungs every 4 (four) hours as needed for wheezing or shortness of breath. 11/28/21  Yes Shirlee LatchEaves, Jenelle Drennon B, PA-C  ?promethazine-dextromethorphan (PROMETHAZINE-DM) 6.25-15 MG/5ML syrup Take 5 mLs by mouth 4 (four) times daily as needed. 11/28/21  Yes Shirlee LatchEaves, Gayle Collard B, PA-C  ? ? ?Family History ?Family History  ?Problem Relation Age of Onset  ? Hypertension Mother   ? Bipolar disorder Father   ? ? ?Social History ?Social History  ? ?Tobacco Use  ? Smoking status: Every Day  ?  Packs/day: 2.00  ?  Years: 10.00  ?  Pack years: 20.00  ?  Types: Cigarettes  ?  Smokeless tobacco: Current  ?Vaping Use  ? Vaping Use: Former  ?Substance Use Topics  ? Alcohol use: Yes  ?  Comment: occassional - once per month  ? Drug use: No  ? ? ? ?Allergies   ?Poison ivy extract [poison ivy extract] ? ? ?Review of Systems ?Review of Systems  ?Constitutional:  Positive for fatigue and fever.  ?HENT:  Positive for congestion, rhinorrhea and sore throat. Negative for sinus pressure and sinus pain.   ?Respiratory:  Positive for cough and shortness of breath.   ?Gastrointestinal:  Negative for abdominal pain, diarrhea, nausea and vomiting.  ?Musculoskeletal:  Negative for myalgias.  ?Neurological:  Negative for weakness, light-headedness and headaches.  ?Hematological:  Negative for adenopathy.  ? ? ?Physical Exam ?Triage Vital Signs ?ED Triage Vitals  ?Enc Vitals Group  ?   BP --   ?   Pulse --   ?   Resp --   ?   Temp --   ?   Temp src --   ?   SpO2 --   ?   Weight 11/28/21 1851 139 lb 15.9 oz (63.5 kg)  ?   Height 11/28/21 1851 5\' 11"  (1.803 m)  ?   Head Circumference --   ?   Peak Flow --   ?   Pain Score  11/28/21 1849 5  ?   Pain Loc --   ?   Pain Edu? --   ?   Excl. in GC? --   ? ?No data found. ? ?Updated Vital Signs ?BP 126/84 (BP Location: Left Arm)   Pulse 88   Temp (!) 100.9 ?F (38.3 ?C) (Oral)   Resp 16   Ht 5\' 11"  (1.803 m)   Wt 139 lb 15.9 oz (63.5 kg)   SpO2 99%   BMI 19.52 kg/m?  ?   ? ?Physical Exam ?Vitals and nursing note reviewed.  ?Constitutional:   ?   General: He is not in acute distress. ?   Appearance: Normal appearance. He is well-developed. He is ill-appearing.  ?HENT:  ?   Head: Normocephalic and atraumatic.  ?   Nose: Congestion present.  ?   Mouth/Throat:  ?   Mouth: Mucous membranes are moist.  ?   Pharynx: Oropharynx is clear. Posterior oropharyngeal erythema present.  ?Eyes:  ?   General: No scleral icterus. ?   Conjunctiva/sclera: Conjunctivae normal.  ?Cardiovascular:  ?   Rate and Rhythm: Normal rate and regular rhythm.  ?   Heart sounds: Normal heart  sounds.  ?Pulmonary:  ?   Effort: Pulmonary effort is normal. No respiratory distress.  ?   Breath sounds: Wheezing (RLL) present.  ?Musculoskeletal:  ?   Cervical back: Neck supple.  ?Skin: ?   General: Skin is warm and dry.  ?   Capillary Refill: Capillary refill takes less than 2 seconds.  ?Neurological:  ?   General: No focal deficit present.  ?   Mental Status: He is alert. Mental status is at baseline.  ?   Motor: No weakness.  ?   Coordination: Coordination normal.  ?   Gait: Gait normal.  ?Psychiatric:     ?   Mood and Affect: Mood normal.     ?   Behavior: Behavior normal.     ?   Thought Content: Thought content normal.  ? ? ? ?UC Treatments / Results  ?Labs ?(all labs ordered are listed, but only abnormal results are displayed) ?Labs Reviewed - No data to display ? ?EKG ? ? ?Radiology ?DG Chest 2 View ? ?Result Date: 11/28/2021 ?CLINICAL DATA:  Fever, cough, and shortness of breath for 3 days. Spouse with COVID. EXAM: CHEST - 2 VIEW COMPARISON:  None. FINDINGS: The heart size and mediastinal contours are within normal limits. Both lungs are clear. The visualized skeletal structures are unremarkable. IMPRESSION: No active cardiopulmonary disease. Electronically Signed   By: 01/28/2022 M.D.   On: 11/28/2021 19:23   ? ?Procedures ?Procedures (including critical care time) ? ?Medications Ordered in UC ?Medications - No data to display ? ?Initial Impression / Assessment and Plan / UC Course  ?I have reviewed the triage vital signs and the nursing notes. ? ?Pertinent labs & imaging results that were available during my care of the patient were reviewed by me and considered in my medical decision making (see chart for details). ? ?31 year old male presenting for 3-day history of fever, fatigue, nasal congestion, sore throat, cough with new onset of chest pain and shortness of breath today.  Patient reports his wife tested positive for COVID-19 recently.  He has not tested himself as he assumed he was likely  positive.  Temp is 100.9 degrees.  Oxygen stable at 99%.  He is ill-appearing but nontoxic.  He does have erythema posterior pharynx and nasal congestion.  Wheezes of  right lower lobe.  Heart rate 88 bpm and rhythm regular. ? ?Chest x-ray obtained to assess for possibility of pneumonia.  Reviewed by me.  Results do not show any evidence of acute abnormality. ? ?Patient is a smoker so that may have something to do with his wheezing.  Also suspect likely bronchitis related to COVID-19.  Discussed testing for COVID-19 so that I could prescribe an antiviral medication given his mental health history and status is a tobacco user.  He does not want to be tested for COVID-19.  Reviewed current CDC guidelines, isolation protocol and ED precautions.  Sent Promethazine DM and albuterol to pharmacy.  Encouraged ibuprofen and Tylenol for fever control and close monitoring.  Reviewed return and ER precautions.  Offered work note but he declines. ? ? ?Final Clinical Impressions(s) / UC Diagnoses  ? ?Final diagnoses:  ?Viral illness  ?Exposure to COVID-19 virus  ?Acute cough  ?Fever, unspecified  ?Wheezing  ? ? ? ?Discharge Instructions   ? ?  ?-Chest x-ray does not show any evidence of pneumonia. ?- We discussed, you likely have bronchitis related to COVID-19 given your exposure. ?- Need to isolate until Sunday and then wear a mask for 5 days. ?- I sent cough medicine and an inhaler to the pharmacy.  Try to stop smoking.  This definitely has not helped her wheezing/shortness of breath. ?- Increase rest and fluid intake.  Tylenol and/or Motrin as needed for fever control. ?- For any worsening symptoms you need to be seen again.  For any acute worsening of your breathing, return or go to ER. ? ? ? ? ?ED Prescriptions   ? ? Medication Sig Dispense Auth. Provider  ? albuterol (VENTOLIN HFA) 108 (90 Base) MCG/ACT inhaler Inhale 1-2 puffs into the lungs every 4 (four) hours as needed for wheezing or shortness of breath. 1 g Eusebio Friendly B, PA-C  ? promethazine-dextromethorphan (PROMETHAZINE-DM) 6.25-15 MG/5ML syrup Take 5 mLs by mouth 4 (four) times daily as needed. 118 mL Eusebio Friendly B, PA-C  ? ?  ? ?PDMP not reviewed this encounter. ?  ?Michiel Cowboy,

## 2021-11-28 NOTE — Discharge Instructions (Signed)
-  Chest x-ray does not show any evidence of pneumonia. ?- We discussed, you likely have bronchitis related to COVID-19 given your exposure. ?- Need to isolate until Sunday and then wear a mask for 5 days. ?- I sent cough medicine and an inhaler to the pharmacy.  Try to stop smoking.  This definitely has not helped her wheezing/shortness of breath. ?- Increase rest and fluid intake.  Tylenol and/or Motrin as needed for fever control. ?- For any worsening symptoms you need to be seen again.  For any acute worsening of your breathing, return or go to ER. ?

## 2021-12-08 ENCOUNTER — Ambulatory Visit
Admission: EM | Admit: 2021-12-08 | Discharge: 2021-12-08 | Disposition: A | Discharge status: Home / Self Care | Payer: Self-pay

## 2022-08-20 ENCOUNTER — Ambulatory Visit
Admission: EM | Admit: 2022-08-20 | Discharge: 2022-08-20 | Disposition: A | Discharge status: Home / Self Care | Payer: Self-pay | Attending: Urgent Care | Admitting: Urgent Care

## 2022-08-20 DIAGNOSIS — J189 Pneumonia, unspecified organism: Secondary | ICD-10-CM

## 2022-08-20 MED ORDER — AMOXICILLIN-POT CLAVULANATE 875-125 MG PO TABS: 1.0000 | ORAL_TABLET | Freq: Two times a day (BID) | ORAL | 0 refills | Status: AC

## 2022-08-20 MED ORDER — HYDROCOD POLI-CHLORPHE POLI ER 10-8 MG/5ML PO SUER: 5.0000 mL | Freq: Two times a day (BID) | ORAL | 0 refills | Status: AC | PRN

## 2022-08-20 MED ORDER — BENZONATATE 100 MG PO CAPS: ORAL_CAPSULE | ORAL | 0 refills | Status: AC

## 2022-08-20 NOTE — ED Provider Notes (Signed)
Peter Cisneros    CSN: [id number] Arrival date & time: 08/20/22  6203      History   Chief Complaint Chief Complaint  Patient presents with   Cough   Fever   Excessive Sweating   nasal drainage     HPI Chandlor Noecker is a 31 y.o. male.    Cough Associated symptoms: fever   Fever Associated symptoms: cough     Presents to urgent care with complaint of productive cough, sweats, fever, runny nose x 10 days.  He endorses difficulty sleeping due to cough and OTC medication has been ineffective.  Also endorses continued upper respiratory symptoms including runny nose.  He endorses recent documented fever of approximately 101.0 Fahrenheit.  Past Medical History:  Diagnosis Date   Bipolar 1 disorder (HCC)    Depression    Schizophrenia [facility])     Patient Active Problem List   Diagnosis Date Noted   Benzodiazepine abuse (HCC) 03/04/2016   Substance induced mood disorder (HCC) 03/04/2016   Anxiety disorder 02/26/2016   Alcohol use disorder, mild, abuse 02/26/2016   Tobacco use disorder 02/26/2016    Past Surgical History:  Procedure Laterality Date   NO PAST SURGERIES         Home Medications    Prior to Admission medications   Medication Sig Start Date End Date Taking? Authorizing Provider  albuterol (VENTOLIN HFA) 108 (90 Base) MCG/ACT inhaler Inhale 1-2 puffs into the lungs every 4 (four) hours as needed for wheezing or shortness of breath. 11/28/21   Shirlee Latch, PA-C  promethazine-dextromethorphan (PROMETHAZINE-DM) 6.25-15 MG/5ML syrup Take 5 mLs by mouth 4 (four) times daily as needed. 11/28/21   Shirlee Latch, PA-C    Family History Family History  Problem Relation Age of Onset   Hypertension Mother    Bipolar disorder Father     Social History Social History   Tobacco Use   Smoking status: Every Day    Packs/day: 2.00    Years: 10.00    Total pack years: 20.00    Types: Cigarettes   Smokeless tobacco: Current  Vaping Use    Vaping Use: Former  Substance Use Topics   Alcohol use: Yes    Comment: occassional - once per month   Drug use: No     Allergies   Poison ivy extract [poison ivy extract]   Review of Systems Review of Systems  Constitutional:  Positive for fever.  Respiratory:  Positive for cough.      Physical Exam Triage Vital Signs ED Triage Vitals  Enc Vitals Group     BP 08/20/22 1127 120/76     Pulse Rate 08/20/22 1127 85     Resp 08/20/22 1127 18     Temp 08/20/22 1127 98.9 F (37.2 C)     Temp Source 08/20/22 1127 Oral     SpO2 08/20/22 1127 96 %     Weight --      Height --      Head Circumference --      Peak Flow --      Pain Score 08/20/22 1132 0     Pain Loc --      Pain Edu? --      Excl. in GC? --    No data found.  Updated Vital Signs BP 120/76 (BP Location: Left Arm)   Pulse 85   Temp 98.9 F (37.2 C) (Oral)   Resp 18   SpO2 96%  Visual Acuity Right Eye Distance:   Left Eye Distance:   Bilateral Distance:    Right Eye Near:   Left Eye Near:    Bilateral Near:     Physical Exam Vitals reviewed.  Constitutional:      Appearance: He is ill-appearing and diaphoretic.  Cardiovascular:     Rate and Rhythm: Normal rate and regular rhythm.     Pulses: Normal pulses.     Heart sounds: Normal heart sounds.  Pulmonary:     Effort: Pulmonary effort is normal.     Breath sounds: Rhonchi present.  Skin:    General: Skin is warm.  Neurological:     General: No focal deficit present.     Mental Status: He is alert and oriented to person, place, and time.  Psychiatric:        Mood and Affect: Mood normal.        Behavior: Behavior normal.      UC Treatments / Results  Labs (all labs ordered are listed, but only abnormal results are displayed) Labs Reviewed - No data to display  EKG   Radiology No results found.  Procedures Procedures (including critical care time)  Medications Ordered in UC Medications - No data to display  Initial  Impression / Assessment and Plan / UC Course  I have reviewed the triage vital signs and the nursing notes.  Pertinent labs & imaging results that were available during my care of the patient were reviewed by me and considered in my medical decision making (see chart for details).   Patient is afebrile here with recent antipyretics. Satting well on room air. Overall is well appearing, well hydrated, without respiratory distress. Pulmonary exam is remarkable for severe productive cough and rhonchorous breathing in the upper lobes.  Suspect community-acquired pneumonia and will treat with Augmentin x 7 days.  Also prescribing benzonatate and Tussionex for cough.  Final Clinical Impressions(s) / UC Diagnoses   Final diagnoses:  None   Discharge Instructions   None    ED Prescriptions   None    PDMP not reviewed this encounter.   Charma Igo, [place] 08/20/22 1144

## 2022-08-20 NOTE — Discharge Instructions (Signed)
Follow up here or with your primary care provider if your symptoms are worsening or not improving with treatment.     

## 2022-08-20 NOTE — ED Triage Notes (Signed)
Pt. Presents to UC w/ c/o a productive cough, sweats, fever and a runny nose for the past 10 days.

## 2023-07-09 ENCOUNTER — Ambulatory Visit
Admission: EM | Admit: 2023-07-09 | Discharge: 2023-07-09 | Disposition: A | Discharge status: Home / Self Care | Payer: Self-pay | Attending: Emergency Medicine | Admitting: Emergency Medicine

## 2023-07-09 DIAGNOSIS — J069 Acute upper respiratory infection, unspecified: Secondary | ICD-10-CM

## 2023-07-09 MED ORDER — AZITHROMYCIN 250 MG PO TABS: 250.0000 mg | ORAL_TABLET | Freq: Every day | ORAL | 0 refills | Status: AC

## 2023-07-09 MED ORDER — ALBUTEROL SULFATE HFA 108 (90 BASE) MCG/ACT IN AERS: 2.0000 | INHALATION_SPRAY | Freq: Four times a day (QID) | RESPIRATORY_TRACT | 0 refills | Status: AC | PRN

## 2023-07-09 NOTE — Discharge Instructions (Signed)
On exam the left lower aspect of your lung sounds congested and as you experiencing shortness of breath you will prove provided antibiotics for bacterial coverage  Take azithromycin as directed  You may take 2 puffs of the albuterol inhaler every 6 hours as needed if shortness of breath is flared will open and relax the airway  Continue use of pseudoephedrine as well as any of the following below as needed    You can take Tylenol and/or Ibuprofen as needed for fever reduction and pain relief.   For cough: honey 1/2 to 1 teaspoon (you can dilute the honey in water or another fluid).  You can also use guaifenesin and dextromethorphan for cough. You can use a humidifier for chest congestion and cough.  If you don't have a humidifier, you can sit in the bathroom with the hot shower running.      For sore throat: try warm salt water gargles, cepacol lozenges, throat spray, warm tea or water with lemon/honey, popsicles or ice, or OTC cold relief medicine for throat discomfort.   For congestion: take a daily anti-histamine like Zyrtec, Claritin, and a oral decongestant, such as pseudoephedrine.  You can also use Flonase 1-2 sprays in each nostril daily.   It is important to stay hydrated: drink plenty of fluids (water, gatorade/powerade/pedialyte, juices, or teas) to keep your throat moisturized and help further relieve irritation/discomfort.

## 2023-07-09 NOTE — ED Triage Notes (Signed)
Patient presents to UC for body aches, cough, HA x 5 days. Treating with sudafed.

## 2023-07-09 NOTE — ED Provider Notes (Signed)
Peter Cisneros    CSN: [id number] Arrival date & time: 07/09/23  1131      History   Chief Complaint Chief Complaint  Patient presents with   Cough    HPI Peter Cisneros is a 32 y.o. male.   Patient presents for evaluation of nasal congestion, rhinorrhea, productive cough with yellow sputum, shortness of breath with exertion and intermittent headaches present for 5 days.  Exacerbated when laying down.  Initially began as a sore throat which has resolved.  Has been using pseudoephedrine which has been helpful but symptoms have persisted.  Poor appetite but tolerating some food and liquids.  Denies presence of fever, abdominal symptoms or wheezing.  Tobacco use.  Past Medical History:  Diagnosis Date   Bipolar 1 disorder (HCC)    Depression    Schizophrenia [facility])     Patient Active Problem List   Diagnosis Date Noted   Benzodiazepine abuse (HCC) 03/04/2016   Substance induced mood disorder (HCC) 03/04/2016   Anxiety disorder 02/26/2016   Alcohol use disorder, mild, abuse 02/26/2016   Tobacco use disorder 02/26/2016    Past Surgical History:  Procedure Laterality Date   NO PAST SURGERIES         Home Medications    Prior to Admission medications   Medication Sig Start Date End Date Taking? Authorizing Provider  azithromycin (ZITHROMAX) 250 MG tablet Take 1 tablet (250 mg total) by mouth daily. Take first 2 tablets together, then 1 every day until finished. 07/09/23  Yes Veniamin Kincaid R, NP  albuterol (VENTOLIN HFA) 108 (90 Base) MCG/ACT inhaler Inhale 2 puffs into the lungs every 6 (six) hours as needed for wheezing or shortness of breath. 07/09/23   Valinda Hoar, NP  amoxicillin-clavulanate (AUGMENTIN) 875-125 MG tablet Take 1 tablet by mouth every 12 (twelve) hours. 08/20/22   Immordino, Jeannett Senior, FNP  benzonatate (TESSALON) 100 MG capsule Take 1-2 tablets 3 times a day as needed for cough 08/20/22   Immordino, Stephen, FNP    Family  History Family History  Problem Relation Age of Onset   Hypertension Mother    Bipolar disorder Father     Social History Social History   Tobacco Use   Smoking status: Every Day    Current packs/day: 2.00    Average packs/day: 2.0 packs/day for 10.0 years (20.0 ttl pk-yrs)    Types: Cigarettes   Smokeless tobacco: Current  Vaping Use   Vaping status: Former  Substance Use Topics   Alcohol use: Yes    Comment: occassional - once per month   Drug use: No     Allergies   Poison ivy extract [poison ivy extract]   Review of Systems Review of Systems  Respiratory:  Positive for cough.      Physical Exam Triage Vital Signs ED Triage Vitals  Encounter Vitals Group     BP 07/09/23 1211 (!) 143/87     Systolic BP Percentile --      Diastolic BP Percentile --      Pulse Rate 07/09/23 1211 (!) 104     Resp 07/09/23 1211 16     Temp 07/09/23 1211 99.2 F (37.3 C)     Temp Source 07/09/23 1211 Oral     SpO2 07/09/23 1211 95 %     Weight --      Height --      Head Circumference --      Peak Flow --      Pain  Score 07/09/23 1210 0     Pain Loc --      Pain Education --      Exclude from Growth Chart --    No data found.  Updated Vital Signs BP (!) 143/87 (BP Location: Left Arm)   Pulse (!) 104   Temp 99.2 F (37.3 C) (Oral)   Resp 16   SpO2 95%   Visual Acuity Right Eye Distance:   Left Eye Distance:   Bilateral Distance:    Right Eye Near:   Left Eye Near:    Bilateral Near:     Physical Exam Constitutional:      Appearance: Normal appearance.  HENT:     Right Ear: Tympanic membrane, ear canal and external ear normal.     Left Ear: Tympanic membrane, ear canal and external ear normal.     Nose: Congestion present. No rhinorrhea.     Mouth/Throat:     Pharynx: Posterior oropharyngeal erythema present. No oropharyngeal exudate.  Eyes:     Extraocular Movements: Extraocular movements intact.  Cardiovascular:     Rate and Rhythm: Normal rate and  regular rhythm.     Pulses: Normal pulses.     Heart sounds: Normal heart sounds.  Pulmonary:     Effort: Pulmonary effort is normal.     Comments: Rhonchi present to the left lower lobe, remaining lobes clear Skin:    General: Skin is warm and dry.  Neurological:     Mental Status: He is alert and oriented to person, place, and time. Mental status is at baseline.      UC Treatments / Results  Labs (all labs ordered are listed, but only abnormal results are displayed) Labs Reviewed - No data to display  EKG   Radiology No results found.  Procedures Procedures (including critical care time)  Medications Ordered in UC Medications - No data to display  Initial Impression / Assessment and Plan / UC Course  I have reviewed the triage vital signs and the nursing notes.  Pertinent labs & imaging results that were available during my care of the patient were reviewed by me and considered in my medical decision making (see chart for details).  Acute URI  Patient is in no signs of distress nor toxic appearing.  Vital signs are stable.    Testing deferred due to timeline of illness.  Rhonchi heard on exam and as he is experiencing a productive cough and shortness of breath will provide bacterial coverage as he has had pneumonia in the past with similar symptoms, prescribed azithromycin and albuterol inhaler. May use additional over-the-counter medications as needed for supportive care.  May follow-up with urgent care as needed if symptoms persist or worsen.  Note given.   Final Clinical Impressions(s) / UC Diagnoses   Final diagnoses:  Acute URI     Discharge Instructions      On exam the left lower aspect of your lung sounds congested and as you experiencing shortness of breath you will prove provided antibiotics for bacterial coverage  Take azithromycin as directed  You may take 2 puffs of the albuterol inhaler every 6 hours as needed if shortness of breath is flared  will open and relax the airway  Continue use of pseudoephedrine as well as any of the following below as needed    You can take Tylenol and/or Ibuprofen as needed for fever reduction and pain relief.   For cough: honey 1/2 to 1 teaspoon (you can dilute the  honey in water or another fluid).  You can also use guaifenesin and dextromethorphan for cough. You can use a humidifier for chest congestion and cough.  If you don't have a humidifier, you can sit in the bathroom with the hot shower running.      For sore throat: try warm salt water gargles, cepacol lozenges, throat spray, warm tea or water with lemon/honey, popsicles or ice, or OTC cold relief medicine for throat discomfort.   For congestion: take a daily anti-histamine like Zyrtec, Claritin, and a oral decongestant, such as pseudoephedrine.  You can also use Flonase 1-2 sprays in each nostril daily.   It is important to stay hydrated: drink plenty of fluids (water, gatorade/powerade/pedialyte, juices, or teas) to keep your throat moisturized and help further relieve irritation/discomfort.    ED Prescriptions     Medication Sig Dispense Auth. Provider   azithromycin (ZITHROMAX) 250 MG tablet Take 1 tablet (250 mg total) by mouth daily. Take first 2 tablets together, then 1 every day until finished. 6 tablet Tyronica Truxillo R, NP   albuterol (VENTOLIN HFA) 108 (90 Base) MCG/ACT inhaler Inhale 2 puffs into the lungs every 6 (six) hours as needed for wheezing or shortness of breath. 8 g Valinda Hoar, NP      PDMP not reviewed this encounter.   Valinda Hoar, NP 07/09/23 [phone]

## 2023-10-15 ENCOUNTER — Other Ambulatory Visit: Payer: Self-pay

## 2023-10-15 ENCOUNTER — Emergency Department
Admission: EM | Admit: 2023-10-15 | Discharge: 2023-10-15 | Disposition: A | Discharge status: Home / Self Care | Payer: Self-pay | Attending: Emergency Medicine | Admitting: Emergency Medicine

## 2023-10-15 ENCOUNTER — Emergency Department: Payer: Self-pay

## 2023-10-15 ENCOUNTER — Ambulatory Visit: Admission: EM | Admit: 2023-10-15 | Discharge: 2023-10-15 | Discharge status: Against Medical Advice | Payer: Self-pay

## 2023-10-15 DIAGNOSIS — Y92410 Unspecified street and highway as the place of occurrence of the external cause: Secondary | ICD-10-CM | POA: Insufficient documentation

## 2023-10-15 DIAGNOSIS — S29019A Strain of muscle and tendon of unspecified wall of thorax, initial encounter: Secondary | ICD-10-CM

## 2023-10-15 DIAGNOSIS — S199XXA Unspecified injury of neck, initial encounter: Secondary | ICD-10-CM | POA: Diagnosis present

## 2023-10-15 DIAGNOSIS — S29012A Strain of muscle and tendon of back wall of thorax, initial encounter: Secondary | ICD-10-CM | POA: Insufficient documentation

## 2023-10-15 DIAGNOSIS — S161XXA Strain of muscle, fascia and tendon at neck level, initial encounter: Secondary | ICD-10-CM | POA: Diagnosis not present

## 2023-10-15 MED ORDER — LIDOCAINE 5 % EX PTCH
2.0000 | MEDICATED_PATCH | Freq: Once | CUTANEOUS | Status: DC
  Administered 2023-10-15: 2 via TRANSDERMAL
  Filled 2023-10-15: qty 2

## 2023-10-15 MED ORDER — CYCLOBENZAPRINE HCL 5 MG PO TABS: 5.0000 mg | ORAL_TABLET | Freq: Three times a day (TID) | ORAL | 0 refills | Status: AC | PRN
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
## 2023-10-15 NOTE — ED Notes (Signed)
 Patient transported to radiology
